# Patient Record
Sex: Female | Born: 1960 | Race: White | Hispanic: No | Smoking: Former smoker
Health system: Southern US, Community
[De-identification: ages and names within clinical notes are randomized; demographics above are authoritative.]

## PROBLEM LIST (undated history)

## (undated) DIAGNOSIS — K219 Gastro-esophageal reflux disease without esophagitis: Secondary | ICD-10-CM

## (undated) DIAGNOSIS — E119 Type 2 diabetes mellitus without complications: Secondary | ICD-10-CM

## (undated) DIAGNOSIS — F410 Panic disorder [episodic paroxysmal anxiety] without agoraphobia: Secondary | ICD-10-CM

## (undated) DIAGNOSIS — K5790 Diverticulosis of intestine, part unspecified, without perforation or abscess without bleeding: Secondary | ICD-10-CM

## (undated) DIAGNOSIS — I10 Essential (primary) hypertension: Secondary | ICD-10-CM

## (undated) HISTORY — DX: Diverticulosis of intestine, part unspecified, without perforation or abscess without bleeding: K57.90

## (undated) HISTORY — PX: TUBAL LIGATION: SHX77

## (undated) HISTORY — PX: OTHER SURGICAL HISTORY: SHX169

## (undated) HISTORY — DX: Essential (primary) hypertension: I10

## (undated) HISTORY — PX: BACK SURGERY: SHX140

---

## 2008-02-21 HISTORY — PX: COLONOSCOPY: SHX174

## 2008-12-14 ENCOUNTER — Emergency Department (HOSPITAL_COMMUNITY): Admission: EM | Admit: 2008-12-14 | Discharge: 2008-12-14 | Payer: Self-pay | Admitting: Emergency Medicine

## 2009-02-20 HISTORY — PX: CARDIAC CATHETERIZATION: SHX172

## 2009-06-09 ENCOUNTER — Emergency Department (HOSPITAL_COMMUNITY): Admission: EM | Admit: 2009-06-09 | Discharge: 2009-06-09 | Payer: Self-pay | Admitting: Emergency Medicine

## 2009-06-12 ENCOUNTER — Emergency Department (HOSPITAL_COMMUNITY): Admission: EM | Admit: 2009-06-12 | Discharge: 2009-06-12 | Payer: Self-pay | Admitting: Emergency Medicine

## 2009-08-05 ENCOUNTER — Emergency Department (HOSPITAL_COMMUNITY): Admission: EM | Admit: 2009-08-05 | Discharge: 2009-08-05 | Payer: Self-pay | Admitting: Emergency Medicine

## 2009-08-09 ENCOUNTER — Emergency Department (HOSPITAL_COMMUNITY): Admission: EM | Admit: 2009-08-09 | Discharge: 2009-08-09 | Payer: Self-pay | Admitting: Emergency Medicine

## 2009-09-01 ENCOUNTER — Ambulatory Visit (HOSPITAL_COMMUNITY): Admission: RE | Admit: 2009-09-01 | Discharge: 2009-09-01 | Payer: Self-pay | Admitting: General Surgery

## 2010-01-11 ENCOUNTER — Emergency Department (HOSPITAL_COMMUNITY): Admission: EM | Admit: 2010-01-11 | Discharge: 2010-01-11 | Payer: Self-pay | Admitting: Emergency Medicine

## 2010-02-26 ENCOUNTER — Emergency Department (HOSPITAL_COMMUNITY)
Admission: EM | Admit: 2010-02-26 | Discharge: 2010-02-26 | Payer: Self-pay | Source: Home / Self Care | Admitting: Emergency Medicine

## 2010-03-07 LAB — COMPREHENSIVE METABOLIC PANEL
ALT: 19 U/L (ref 0–35)
AST: 23 U/L (ref 0–37)
Albumin: 3.9 g/dL (ref 3.5–5.2)
Alkaline Phosphatase: 63 U/L (ref 39–117)
BUN: 8 mg/dL (ref 6–23)
CO2: 24 mEq/L (ref 19–32)
Calcium: 9.1 mg/dL (ref 8.4–10.5)
Chloride: 106 mEq/L (ref 96–112)
Creatinine, Ser: 1.02 mg/dL (ref 0.4–1.2)
GFR calc Af Amer: >60 mL/min (ref >60)
GFR calc non Af Amer: 58 mL/min — ABNORMAL LOW (ref >60)
Glucose, Bld: 101 mg/dL — ABNORMAL HIGH (ref 70–99)
Potassium: 3.9 mEq/L (ref 3.5–5.1)
Sodium: 141 mEq/L (ref 135–145)
Total Bilirubin: 0.6 mg/dL (ref 0.3–1.2)
Total Protein: 7 g/dL (ref 6.0–8.3)

## 2010-03-07 LAB — DIFFERENTIAL
Basophils Absolute: 0 10*3/uL (ref 0.0–0.1)
Basophils Relative: 0 % (ref 0–1)
Eosinophils Absolute: 0.1 10*3/uL (ref 0.0–0.7)
Eosinophils Relative: 1 % (ref 0–5)
Lymphocytes Relative: 6 % — ABNORMAL LOW (ref 12–46)
Lymphs Abs: 0.8 10*3/uL (ref 0.7–4.0)
Monocytes Absolute: 0.8 10*3/uL (ref 0.1–1.0)
Monocytes Relative: 6 % (ref 3–12)
Neutro Abs: 12.8 10*3/uL — ABNORMAL HIGH (ref 1.7–7.7)
Neutrophils Relative %: 88 % — ABNORMAL HIGH (ref 43–77)

## 2010-03-07 LAB — URINALYSIS, ROUTINE W REFLEX MICROSCOPIC
Bilirubin Urine: NEGATIVE
Ketones, ur: NEGATIVE mg/dL
Leukocytes, UA: NEGATIVE
Nitrite: NEGATIVE
Protein, ur: NEGATIVE mg/dL
Specific Gravity, Urine: 1.015 (ref 1.005–1.030)
Urine Glucose, Fasting: NEGATIVE mg/dL
Urobilinogen, UA: 0.2 mg/dL (ref 0.0–1.0)
pH: 8 (ref 5.0–8.0)

## 2010-03-07 LAB — CBC
HCT: 39.6 % (ref 36.0–46.0)
Hemoglobin: 13.7 g/dL (ref 12.0–15.0)
MCH: 32.5 pg (ref 26.0–34.0)
MCHC: 34.6 g/dL (ref 30.0–36.0)
MCV: 94.1 fL (ref 78.0–100.0)
Platelets: 268 10*3/uL (ref 150–400)
RBC: 4.21 MIL/uL (ref 3.87–5.11)
RDW: 13.9 % (ref 11.5–15.5)
WBC: 14.6 10*3/uL — ABNORMAL HIGH (ref 4.0–10.5)

## 2010-03-07 LAB — URINE MICROSCOPIC-ADD ON

## 2010-03-07 LAB — LIPASE, BLOOD: Lipase: 24 U/L (ref 11–59)

## 2010-03-07 LAB — PREGNANCY, URINE: Preg Test, Ur: NEGATIVE

## 2010-05-03 LAB — POCT PREGNANCY, URINE: Preg Test, Ur: NEGATIVE

## 2010-05-08 LAB — CBC
HCT: 36.3 % (ref 36.0–46.0)
Hemoglobin: 12.4 g/dL (ref 12.0–15.0)
MCH: 32.5 pg (ref 26.0–34.0)
MCHC: 34.3 g/dL (ref 30.0–36.0)
MCV: 94.7 fL (ref 78.0–100.0)
Platelets: 335 10*3/uL (ref 150–400)
RBC: 3.83 MIL/uL — ABNORMAL LOW (ref 3.87–5.11)
RDW: 14.1 % (ref 11.5–15.5)
WBC: 10.6 10*3/uL — ABNORMAL HIGH (ref 4.0–10.5)

## 2010-05-08 LAB — BASIC METABOLIC PANEL
BUN: 8 mg/dL (ref 6–23)
CO2: 26 mEq/L (ref 19–32)
Calcium: 9.6 mg/dL (ref 8.4–10.5)
Chloride: 105 mEq/L (ref 96–112)
Creatinine, Ser: 0.92 mg/dL (ref 0.4–1.2)
GFR calc Af Amer: >60 mL/min (ref >60)
GFR calc non Af Amer: >60 mL/min (ref >60)
Glucose, Bld: 86 mg/dL (ref 70–99)
Potassium: 4 mEq/L (ref 3.5–5.1)
Sodium: 138 mEq/L (ref 135–145)

## 2010-05-08 LAB — SURGICAL PCR SCREEN
MRSA, PCR: NEGATIVE
Staphylococcus aureus: NEGATIVE

## 2010-05-08 LAB — HCG, QUANTITATIVE, PREGNANCY: hCG, Beta Chain, Quant, S: <2 m[IU]/mL (ref <5)

## 2010-05-26 LAB — WET PREP, GENITAL
Clue Cells Wet Prep HPF POC: NONE SEEN
Trich, Wet Prep: NONE SEEN
WBC, Wet Prep HPF POC: NONE SEEN
Yeast Wet Prep HPF POC: NONE SEEN

## 2010-05-26 LAB — URINALYSIS, ROUTINE W REFLEX MICROSCOPIC
Bilirubin Urine: NEGATIVE
Glucose, UA: NEGATIVE mg/dL
Ketones, ur: NEGATIVE mg/dL
Leukocytes, UA: NEGATIVE
Nitrite: NEGATIVE
Protein, ur: NEGATIVE mg/dL
Specific Gravity, Urine: <1.005 — ABNORMAL LOW (ref 1.005–1.030)
Urobilinogen, UA: 0.2 mg/dL (ref 0.0–1.0)
pH: 5.5 (ref 5.0–8.0)

## 2010-05-26 LAB — URINE MICROSCOPIC-ADD ON

## 2010-05-26 LAB — RPR: RPR Ser Ql: NONREACTIVE

## 2010-05-26 LAB — GC/CHLAMYDIA PROBE AMP, GENITAL
Chlamydia, DNA Probe: NEGATIVE
GC Probe Amp, Genital: NEGATIVE

## 2010-05-26 LAB — PREGNANCY, URINE: Preg Test, Ur: NEGATIVE

## 2010-10-21 ENCOUNTER — Inpatient Hospital Stay (HOSPITAL_COMMUNITY)
Admission: EM | Admit: 2010-10-21 | Discharge: 2010-10-23 | DRG: 313 | Disposition: A | Discharge status: Home / Self Care | Payer: Self-pay | Attending: Internal Medicine | Admitting: Internal Medicine

## 2010-10-21 ENCOUNTER — Other Ambulatory Visit: Payer: Self-pay

## 2010-10-21 ENCOUNTER — Emergency Department (HOSPITAL_COMMUNITY): Payer: Self-pay

## 2010-10-21 ENCOUNTER — Encounter: Payer: Self-pay | Admitting: Emergency Medicine

## 2010-10-21 DIAGNOSIS — F410 Panic disorder [episodic paroxysmal anxiety] without agoraphobia: Secondary | ICD-10-CM | POA: Diagnosis present

## 2010-10-21 DIAGNOSIS — R079 Chest pain, unspecified: Secondary | ICD-10-CM | POA: Diagnosis present

## 2010-10-21 DIAGNOSIS — R0789 Other chest pain: Principal | ICD-10-CM | POA: Diagnosis present

## 2010-10-21 DIAGNOSIS — E876 Hypokalemia: Secondary | ICD-10-CM | POA: Diagnosis present

## 2010-10-21 DIAGNOSIS — I10 Essential (primary) hypertension: Secondary | ICD-10-CM | POA: Diagnosis present

## 2010-10-21 HISTORY — DX: Panic disorder (episodic paroxysmal anxiety): F41.0

## 2010-10-21 LAB — BASIC METABOLIC PANEL
BUN: 14 mg/dL (ref 6–23)
CO2: 28 mEq/L (ref 19–32)
Chloride: 96 mEq/L (ref 96–112)
Creatinine, Ser: 0.8 mg/dL (ref 0.50–1.10)
Glucose, Bld: 128 mg/dL — ABNORMAL HIGH (ref 70–99)
Potassium: 3.2 mEq/L — ABNORMAL LOW (ref 3.5–5.1)

## 2010-10-21 LAB — PROTIME-INR
INR: 1.01 (ref 0.00–1.49)
Prothrombin Time: 13.5 seconds (ref 11.6–15.2)

## 2010-10-21 LAB — CBC
HCT: 42 % (ref 36.0–46.0)
Hemoglobin: 14.6 g/dL (ref 12.0–15.0)
MCHC: 34.8 g/dL (ref 30.0–36.0)
WBC: 16.4 10*3/uL — ABNORMAL HIGH (ref 4.0–10.5)

## 2010-10-21 LAB — POCT I-STAT TROPONIN I: Troponin i, poc: 0 ng/mL (ref 0.00–0.08)

## 2010-10-21 LAB — DIFFERENTIAL
Basophils Absolute: 0 10*3/uL (ref 0.0–0.1)
Basophils Relative: 0 % (ref 0–1)
Lymphocytes Relative: 21 % (ref 12–46)
Monocytes Absolute: 1.4 10*3/uL — ABNORMAL HIGH (ref 0.1–1.0)
Monocytes Relative: 8 % (ref 3–12)
Neutro Abs: 11.5 10*3/uL — ABNORMAL HIGH (ref 1.7–7.7)
Neutrophils Relative %: 70 % (ref 43–77)

## 2010-10-21 LAB — CARDIAC PANEL(CRET KIN+CKTOT+MB+TROPI)
Relative Index: INVALID (ref 0.0–2.5)
Troponin I: <0.3 ng/mL (ref <0.30)

## 2010-10-21 MED ORDER — MORPHINE SULFATE 4 MG/ML IJ SOLN
4.0000 mg | Freq: Once | INTRAMUSCULAR | Status: AC
  Administered 2010-10-21: 4 mg via INTRAVENOUS
  Filled 2010-10-21: qty 1

## 2010-10-21 MED ORDER — ASPIRIN 81 MG PO CHEW
324.0000 mg | CHEWABLE_TABLET | Freq: Once | ORAL | Status: AC
  Administered 2010-10-21: 324 mg via ORAL
  Filled 2010-10-21: qty 4

## 2010-10-21 MED ORDER — HEPARIN BOLUS VIA INFUSION
5000.0000 [IU] | Freq: Once | INTRAVENOUS | Status: AC
  Administered 2010-10-21: 5000 [IU] via INTRAVENOUS
  Filled 2010-10-21: qty 5000

## 2010-10-21 MED ORDER — POTASSIUM CHLORIDE CRYS ER 20 MEQ PO TBCR
40.0000 meq | EXTENDED_RELEASE_TABLET | Freq: Once | ORAL | Status: AC
  Administered 2010-10-21: 40 meq via ORAL
  Filled 2010-10-21: qty 2

## 2010-10-21 MED ORDER — HEPARIN (PORCINE) IN NACL 100-0.45 UNIT/ML-% IJ SOLN: 12.0000 [IU]/kg/h | INTRAMUSCULAR | Status: DC

## 2010-10-21 MED ORDER — IBUPROFEN 400 MG PO TABS
400.0000 mg | ORAL_TABLET | Freq: Once | ORAL | Status: AC
  Administered 2010-10-21: 400 mg via ORAL
  Filled 2010-10-21: qty 1

## 2010-10-21 MED ORDER — HEPARIN (PORCINE) IN NACL 100-0.45 UNIT/ML-% IJ SOLN
INTRAMUSCULAR | Status: AC
  Filled 2010-10-21: qty 250

## 2010-10-21 MED ORDER — ALPRAZOLAM 0.5 MG PO TABS
1.0000 mg | ORAL_TABLET | Freq: Once | ORAL | Status: AC
  Administered 2010-10-21: 1 mg via ORAL
  Filled 2010-10-21: qty 2

## 2010-10-21 MED ORDER — NITROGLYCERIN 2 % TD OINT
1.0000 [in_us] | TOPICAL_OINTMENT | Freq: Once | TRANSDERMAL | Status: AC
  Administered 2010-10-21: 66.6667 [in_us] via TOPICAL
  Filled 2010-10-21: qty 1

## 2010-10-21 MED ORDER — NITROGLYCERIN IN D5W 200-5 MCG/ML-% IV SOLN
5.0000 ug/min | Freq: Once | INTRAVENOUS | Status: AC
  Administered 2010-10-21: 5 ug/min via INTRAVENOUS
  Filled 2010-10-21: qty 250

## 2010-10-21 NOTE — H&P (Signed)
Tiffany Barker is an 50 y.o. female.  She is followed by the health department.  Chief Complaint: Chest pain, and high blood pressure  HPI: This is a 50 year old Caucasian female, with a past medical history of hypertension. This was diagnosed about 6 months ago. She was started on hydrochlorothiazide. About a week and a half ago she ran out of her medication and so missed taking about 5 days' worth. She noticed that her blood pressure was very high at that time. She also had some chest pain, which was a pressure-like sensation, but it lasted only a few minutes and then she was comfortable. She tells me that her blood pressure has been high in the last few weeks. She's been having some headaches and dizzy spells because of the high blood pressure. This morning she started having chest pressure. Was described as a pressure-like sensation in the center of her chest. It was 10 out of 10 in intensity. It was associated with some shortness of breath. She had some chronic cough. Had some palpitations. Denies any nausea, vomiting. The pain did not radiate to her arm or neck. Currently, the pain is more in the epigastric area and is 3 out of 10 in intensity. She tells me that she had similar chest pain about 3-4 years ago and had a stress test in [place], which was thought to be normal. She was also very anxious earlier today and had had a panic attack. Denies any focal weakness.   Prior to Admission medications   Medication Sig Start Date End Date Taking? Authorizing Tavari Loadholt  diphenhydrAMINE (BENADRYL) 25 mg capsule Take 25 mg by mouth at bedtime as needed.     Yes Historical Chuong Casebeer, MD  hydrochlorothiazide 25 MG tablet Take 25 mg by mouth daily.     Yes Historical Perris Conwell, MD  Ibuprofen-Diphenhydramine Cit (ADVIL PM PO) Take 1 tablet by mouth at bedtime as needed. For sleep    Yes Historical Keiandra Sullenger, MD    Allergies:  Allergies  Allergen Reactions  . Wasp Venom Anaphylaxis  . Percocet  (Oxycodone-Acetaminophen) Itching    Past Medical History  Diagnosis Date  . Hypertension   . Panic attacks     Past Surgical History  Procedure Date  . Back surgery   . Tubal ligation     Social History:  reports that she has quit smoking. She does not have any smokeless tobacco history on file. She reports that she drinks alcohol. She reports that she does not use illicit drugs.  Family History:  Family History  Problem Relation Age of Onset  . Coronary artery disease Mother   . Coronary artery disease Father     Review of Systems  Constitutional: Negative.   Eyes: Negative.   Respiratory: Positive for shortness of breath.   Cardiovascular: Positive for chest pain and palpitations.  Gastrointestinal: Positive for abdominal pain.  Genitourinary: Negative.   Musculoskeletal: Negative.   Skin: Negative.   Neurological: Positive for headaches.  Endo/Heme/Allergies: Negative.   Psychiatric/Behavioral: The patient is nervous/anxious.     Blood pressure 146/94, pulse 83, temperature 98.5 F (36.9 C), temperature source Oral, resp. rate 17, height 5\' 3"  (1.6 m), weight 83.915 kg (185 lb), last menstrual period 10/14/2010, SpO2 98.00%. Physical Exam  Vitals reviewed. Constitutional: She is oriented to person, place, and time. She appears well-developed and well-nourished. No distress.  HENT:  Head: Normocephalic and atraumatic.  Mouth/Throat: Oropharynx is clear and moist. No oropharyngeal exudate.  Eyes: EOM are normal.  Pupils are equal, round, and reactive to light. Right eye exhibits no discharge. Left eye exhibits no discharge. No scleral icterus.  Neck: Normal range of motion. Neck supple. No tracheal deviation present. No thyromegaly present.  Cardiovascular: Normal rate, regular rhythm, normal heart sounds, intact distal pulses and normal pulses.   Occasional extrasystoles are present. Exam reveals no gallop, no distant heart sounds and no friction rub.   No murmur  heard. Pulmonary/Chest: Effort normal and breath sounds normal. No respiratory distress. She has no wheezes. She has no rales.  Abdominal: Soft. She exhibits no mass. There is tenderness in the epigastric area. There is no rebound and no guarding.  Musculoskeletal: Normal range of motion.  Lymphadenopathy:    She has no cervical adenopathy.  Neurological: She is alert and oriented to person, place, and time. No cranial nerve deficit.  Skin: Skin is warm and dry. She is not diaphoretic.  Psychiatric: She has a normal mood and affect.     Results for orders placed during the hospital encounter of 10/21/10 (from the past 48 hour(s))  BASIC METABOLIC PANEL     Status: Abnormal   Collection Time   10/21/10  6:35 PM      Component Value Range Comment   Sodium 136  135 - 145 (mEq/L)    Potassium 3.2 (*) 3.5 - 5.1 (mEq/L)    Chloride 96  96 - 112 (mEq/L)    CO2 28  19 - 32 (mEq/L)    Glucose, Bld 128 (*) 70 - 99 (mg/dL)    BUN 14  6 - 23 (mg/dL)    Creatinine, Ser 1.61  0.50 - 1.10 (mg/dL)    Calcium 09.6  8.4 - 10.5 (mg/dL)    GFR calc non Af Amer >60  >60 (mL/min)    GFR calc Af Amer >60  >60 (mL/min)   CBC     Status: Abnormal   Collection Time   10/21/10  6:35 PM      Component Value Range Comment   WBC 16.4 (*) 4.0 - 10.5 (K/uL)    RBC 4.47  3.87 - 5.11 (MIL/uL)    Hemoglobin 14.6  12.0 - 15.0 (g/dL)    HCT 04.5  40.9 - 81.1 (%)    MCV 94.0  78.0 - 100.0 (fL)    MCH 32.7  26.0 - 34.0 (pg)    MCHC 34.8  30.0 - 36.0 (g/dL)    RDW 91.4  78.2 - 95.6 (%)    Platelets 328  150 - 400 (K/uL)   DIFFERENTIAL     Status: Abnormal   Collection Time   10/21/10  6:35 PM      Component Value Range Comment   Neutrophils Relative 70  43 - 77 (%)    Neutro Abs 11.5 (*) 1.7 - 7.7 (K/uL)    Lymphocytes Relative 21  12 - 46 (%)    Lymphs Abs 3.4  0.7 - 4.0 (K/uL)    Monocytes Relative 8  3 - 12 (%)    Monocytes Absolute 1.4 (*) 0.1 - 1.0 (K/uL)    Eosinophils Relative 1  0 - 5 (%)     Eosinophils Absolute 0.2  0.0 - 0.7 (K/uL)    Basophils Relative 0  0 - 1 (%)    Basophils Absolute 0.0  0.0 - 0.1 (K/uL)   CARDIAC PANEL(CRET KIN+CKTOT+MB+TROPI)     Status: Normal   Collection Time   10/21/10  6:36 PM      Component  Value Range Comment   Total CK 85  7 - 177 (U/L)    CK, MB 1.5  0.3 - 4.0 (ng/mL)    Troponin I <0.30  <0.30 (ng/mL)    Relative Index RELATIVE INDEX IS INVALID  0.0 - 2.5    APTT     Status: Normal   Collection Time   10/21/10  6:36 PM      Component Value Range Comment   aPTT 28  24 - 37 (seconds)   PROTIME-INR     Status: Normal   Collection Time   10/21/10  6:36 PM      Component Value Range Comment   Prothrombin Time 13.5  11.6 - 15.2 (seconds)    INR 1.01  0.00 - 1.49    POCT I-STAT TROPONIN I     Status: Normal   Collection Time   10/21/10 10:22 PM      Component Value Range Comment   Troponin i, poc 0.00  0.00 - 0.08 (ng/mL)    Comment 3             Dg Chest Portable 1 View  10/21/2010  *RADIOLOGY REPORT*  Clinical Data: Chest pain and pressure.  Hypertension.  Panic attacks.  PORTABLE CHEST - 1 VIEW  Comparison: 01/11/2010  Findings: Midline trachea.  Normal heart size and mediastinal contours. No pleural effusion or pneumothorax.  Clear lungs.  IMPRESSION: No acute cardiopulmonary disease.  Original Report Authenticated By: Consuello Bossier, M.D.   EKG shows sinus tachycardia at 101. Axis is normal. No definite Q waves. Intervals are normal. Some T wave flattening noted. No acute ST changes noted. No older EKGs available for comparison.  Assessment/Plan  Principal Problem:  *Chest pain Active Problems:  Accelerated hypertension   #1 chest pain: The case was discussed by the ED physician with Dr. Shirlee Latch. And it was felt that the chest pain is probably from her accelerated hypertension. In any case her EKG looks fine. There are no ischemic changes. She will be observed in the hospital overnight. She'll be ruled out for acute chronic syndrome  by serial cardiac enzymes. EKG will be repeated in the morning. A lipid panel will also be checked.  #2 accelerated hypertension: We will put on nitro paste. We'll give her metoprolol 3 times a day. She may require additional antihypertensive agents. Depending on what her blood pressures do overnight further management options will have to be considered.  #3 hypokalemia. This will be repleted.  #4 mild epigastric pain: She tells me that she has gallstones. So, we'll go ahead and check LFTs, as well as lipase level.  We will put her on full dose Lovenox for now till her enzymes come back negative.  She's a full code.  Further management decisions will depend on results of further testing and patient's response to treatment.  Leukocytosis: The etiology for this is not entirely clear. She is afebrile. We will recheck counts in the morning.  KRISHNAN,GOKUL 10/21/2010, 11:48 PM

## 2010-10-21 NOTE — ED Provider Notes (Signed)
Scribed for Vida Roller, MD, the patient was seen in room APA18/[facility]18. This chart was scribed by [employer]. The patient's care started at 18:09  CSN: [id number] Arrival date & time: 10/21/2010  6:09 PM  Chief Complaint  Patient presents with  . Chest Pain   HPI Tiffany Barker is a 50 y.o. female with a history of HTN who presents to the Emergency Department complaining of intermittent chest pain, onset 2 weeks. She describes pain as a pressure and states that it worsened today and started suddenly with mild shortness of breath. Patient Complains that blood pressure was 176/126 before she took her medication. Has HTN prescriptions but forgot to go get them. Has been on medication for the past 10-11 days without relief. Patient denies nausea, vomiting, diarrhea or dizziness.Chest pain is alleviated by laying down but aggravated by walking up the stairs. Pt reports to having a stress test in 2007 and was told of a minor blockage "but nothing to worry about".  HPI ELEMENTS:  Location: Chest  Duration: 2 weeks  Timing: constant Quality: Pressure  Modifying factors: aggravated by walking up the stair and alleviated by laying down Context:  as above  Associated symptoms: as above    Past Medical History  Diagnosis Date  . Hypertension   . Panic attacks    MEDICATIONS:  Previous Medications   DIPHENHYDRAMINE (BENADRYL) 25 MG CAPSULE    Take 25 mg by mouth at bedtime as needed.     HYDROCHLOROTHIAZIDE 25 MG TABLET    Take 25 mg by mouth daily.     IBUPROFEN-DIPHENHYDRAMINE CIT (ADVIL PM PO)    Take 1 tablet by mouth at bedtime as needed. For sleep      ALLERGIES:  Allergies as of 10/21/2010 - Review Complete 10/21/2010  Allergen Reaction Noted  . Wasp venom Anaphylaxis 10/21/2010  . Percocet (oxycodone-acetaminophen) Itching 10/21/2010      Past Surgical History  Procedure Date  . Back surgery   . Tubal ligation     History reviewed. No pertinent family  history. diabetes mellitus   History  Substance Use Topics  . Smoking status: Former Games developer  . Smokeless tobacco: Not on file  . Alcohol Use: Yes     occasionaly    OB History    Grav Para Term Preterm Abortions TAB SAB Ect Mult Living                  Review of Systems  Physical Exam  BP 157/104  Pulse 91  Temp(Src) 98.5 F (36.9 C) (Oral)  Resp 17  SpO2 97%  LMP 10/14/2010  Physical Exam  Constitutional: She is oriented to person, place, and time.  HENT:  Mouth/Throat: Oropharynx is clear and moist.  Neck:       Small lymphadenotomy right anterior chain about 1cm.  Cardiovascular: Normal rate, regular rhythm and normal heart sounds.   No murmur heard. Pulmonary/Chest: Effort normal and breath sounds normal. No respiratory distress. She has no wheezes. She has no rales.  Abdominal: Soft. Bowel sounds are normal.  Musculoskeletal: She exhibits no edema (not different from baseline).  Neurological: She is alert and oriented to person, place, and time. No cranial nerve deficit.    ED Course  Procedures  OTHER DATA REVIEWED: Nursing notes, vital signs, and past medical records reviewed.    DIAGNOSTIC STUDIES: Oxygen Saturation is 97% on room air, normal by my interpretation.    LABS / RADIOLOGY:  Results for orders placed during  the hospital encounter of 10/21/10  BASIC METABOLIC PANEL      Component Value Range   Sodium 136  135 - 145 (mEq/L)   Potassium 3.2 (*) 3.5 - 5.1 (mEq/L)   Chloride 96  96 - 112 (mEq/L)   CO2 28  19 - 32 (mEq/L)   Glucose, Bld 128 (*) 70 - 99 (mg/dL)   BUN 14  6 - 23 (mg/dL)   Creatinine, Ser 1.61  0.50 - 1.10 (mg/dL)   Calcium 09.6  8.4 - 10.5 (mg/dL)   GFR calc non Af Amer >60  >60 (mL/min)   GFR calc Af Amer >60  >60 (mL/min)  CARDIAC PANEL(CRET KIN+CKTOT+MB+TROPI)      Component Value Range   Total CK 85  7 - 177 (U/L)   CK, MB 1.5  0.3 - 4.0 (ng/mL)   Troponin I <0.30  <0.30 (ng/mL)   Relative Index RELATIVE INDEX IS  INVALID  0.0 - 2.5   CBC      Component Value Range   WBC 16.4 (*) 4.0 - 10.5 (K/uL)   RBC 4.47  3.87 - 5.11 (MIL/uL)   Hemoglobin 14.6  12.0 - 15.0 (g/dL)   HCT 04.5  40.9 - 81.1 (%)   MCV 94.0  78.0 - 100.0 (fL)   MCH 32.7  26.0 - 34.0 (pg)   MCHC 34.8  30.0 - 36.0 (g/dL)   RDW 91.4  78.2 - 95.6 (%)   Platelets 328  150 - 400 (K/uL)  DIFFERENTIAL      Component Value Range   Neutrophils Relative 70  43 - 77 (%)   Neutro Abs 11.5 (*) 1.7 - 7.7 (K/uL)   Lymphocytes Relative 21  12 - 46 (%)   Lymphs Abs 3.4  0.7 - 4.0 (K/uL)   Monocytes Relative 8  3 - 12 (%)   Monocytes Absolute 1.4 (*) 0.1 - 1.0 (K/uL)   Eosinophils Relative 1  0 - 5 (%)   Eosinophils Absolute 0.2  0.0 - 0.7 (K/uL)   Basophils Relative 0  0 - 1 (%)   Basophils Absolute 0.0  0.0 - 0.1 (K/uL)  APTT      Component Value Range   aPTT 28  24 - 37 (seconds)  PROTIME-INR      Component Value Range   Prothrombin Time 13.5  11.6 - 15.2 (seconds)   INR 1.01  0.00 - 1.49     Dg Chest Portable 1 View  10/21/2010  *RADIOLOGY REPORT*  Clinical Data: Chest pain and pressure.  Hypertension.  Panic attacks.  PORTABLE CHEST - 1 VIEW  Comparison: 01/11/2010  Findings: Midline trachea.  Normal heart size and mediastinal contours. No pleural effusion or pneumothorax.  Clear lungs.  IMPRESSION: No acute cardiopulmonary disease.  Original Report Authenticated By: Consuello Bossier, M.D.      ED COURSE / COORDINATION OF CARE: 18:25 EDMD examined patient and ordered the following Orders Placed This Encounter  Procedures  . DG Chest Portable 1 View  . Basic metabolic panel  . Cardiac panel(cret kin+cktot+mb+tropi)  . CBC  . Differential  . APTT  . Protime-INR  . Cardiac monitoring  . ED EKG     MDM: Ongoing intermittent chest pain getting worse today and persistent for 6 hours by arrival. EKG was nonspecific T-wave findings but otherwise unchanged from prior EKG from July of 2011. Troponin negative, chest x-ray  negative, Will consult cardiology for evaluation and admission.  Pain has almost completely improved on a  nitro drip and morphine for her headache. She still has mild pain.  Cardiology consultation, Dr.Dalton mcLean request medical admission unless enzymes are positive. At this time she has no focal ischemia on her EKG, negative cardiac enzymes and a negative chest x-ray. She is on nitroglycerin and feeling better. Second set of enzymes ordered per Dr. Jearld Pies.    ED ECG REPORT   Date: 10/21/2010 6:12 PM   Rate: 101  Rhythm: sinus tachycardia  QRS Axis: normal  Intervals: normal  ST/T Wave abnormalities: nonspecific T wave changes  Conduction Disutrbances:none  Narrative Interpretation: non spec T wave changes  Old EKG Reviewed: unchanged from 08/25/09  Second cardiac marker is normal.  SCRIBE ATTESTATION: I personally performed the services described in this documentation, which was scribed in my presence. The recorded information has been reviewed and considered. Vida Roller, MD     Hospitalist will admit.  Vida Roller, MD 10/21/10 [phone]

## 2010-10-21 NOTE — ED Notes (Signed)
Pt c/o chest pressure intermittant x 2 weeks. pain worse today and started suddenly with "little " sob. States normally will lay down and pressure eases up but this time it didn't. Nondiaphoretic. Denies n/v/d/dizzy.alert/orietned. Appears comfortable.

## 2010-10-21 NOTE — ED Notes (Signed)
Pt also c/o headache intermittant x 2 wks also.

## 2010-10-22 ENCOUNTER — Encounter (HOSPITAL_COMMUNITY): Payer: Self-pay | Admitting: *Deleted

## 2010-10-22 LAB — COMPREHENSIVE METABOLIC PANEL
ALT: 37 U/L — ABNORMAL HIGH (ref 0–35)
AST: 30 U/L (ref 0–37)
Alkaline Phosphatase: 83 U/L (ref 39–117)
CO2: 28 mEq/L (ref 19–32)
Calcium: 9.3 mg/dL (ref 8.4–10.5)
Chloride: 99 mEq/L (ref 96–112)
GFR calc Af Amer: >60 mL/min (ref >60)
GFR calc non Af Amer: >60 mL/min (ref >60)
Glucose, Bld: 116 mg/dL — ABNORMAL HIGH (ref 70–99)
Potassium: 3.3 mEq/L — ABNORMAL LOW (ref 3.5–5.1)
Sodium: 138 mEq/L (ref 135–145)
Total Bilirubin: 0.2 mg/dL — ABNORMAL LOW (ref 0.3–1.2)

## 2010-10-22 LAB — URINALYSIS, ROUTINE W REFLEX MICROSCOPIC
Glucose, UA: NEGATIVE mg/dL
Specific Gravity, Urine: 1.01 (ref 1.005–1.030)

## 2010-10-22 LAB — MAGNESIUM: Magnesium: 2.1 mg/dL (ref 1.5–2.5)

## 2010-10-22 LAB — CARDIAC PANEL(CRET KIN+CKTOT+MB+TROPI)
CK, MB: 1.6 ng/mL (ref 0.3–4.0)
Relative Index: INVALID (ref 0.0–2.5)
Total CK: 76 U/L (ref 7–177)
Total CK: 76 U/L (ref 7–177)
Total CK: 82 U/L (ref 7–177)

## 2010-10-22 LAB — CBC
MCH: 32.5 pg (ref 26.0–34.0)
MCHC: 34.3 g/dL (ref 30.0–36.0)
Platelets: 314 10*3/uL (ref 150–400)
RDW: 13.4 % (ref 11.5–15.5)

## 2010-10-22 LAB — HEPATIC FUNCTION PANEL
Albumin: 4.1 g/dL (ref 3.5–5.2)
Indirect Bilirubin: 0.1 mg/dL — ABNORMAL LOW (ref 0.3–0.9)
Total Bilirubin: 0.2 mg/dL — ABNORMAL LOW (ref 0.3–1.2)

## 2010-10-22 LAB — MRSA PCR SCREENING: MRSA by PCR: INVALID — AB

## 2010-10-22 LAB — LIPASE, BLOOD: Lipase: 33 U/L (ref 11–59)

## 2010-10-22 LAB — URINE MICROSCOPIC-ADD ON

## 2010-10-22 MED ORDER — IBUPROFEN 400 MG PO TABS
400.0000 mg | ORAL_TABLET | Freq: Three times a day (TID) | ORAL | Status: DC
  Administered 2010-10-22 (×3): 400 mg via ORAL
  Filled 2010-10-22 (×4): qty 1

## 2010-10-22 MED ORDER — ALPRAZOLAM 0.5 MG PO TABS
0.5000 mg | ORAL_TABLET | Freq: Two times a day (BID) | ORAL | Status: DC | PRN
  Administered 2010-10-22 – 2010-10-23 (×2): 0.5 mg via ORAL
  Filled 2010-10-22 (×2): qty 1

## 2010-10-22 MED ORDER — ENOXAPARIN SODIUM 40 MG/0.4ML ~~LOC~~ SOLN
40.0000 mg | SUBCUTANEOUS | Status: DC
  Filled 2010-10-22: qty 0.4

## 2010-10-22 MED ORDER — MORPHINE SULFATE 2 MG/ML IJ SOLN: 2.0000 mg | INTRAMUSCULAR | Status: DC | PRN

## 2010-10-22 MED ORDER — ONDANSETRON HCL 4 MG PO TABS: 4.0000 mg | ORAL_TABLET | Freq: Four times a day (QID) | ORAL | Status: DC | PRN

## 2010-10-22 MED ORDER — ASPIRIN EC 81 MG PO TBEC
81.0000 mg | DELAYED_RELEASE_TABLET | Freq: Every day | ORAL | Status: DC
  Administered 2010-10-22 – 2010-10-23 (×2): 81 mg via ORAL
  Filled 2010-10-22 (×2): qty 1

## 2010-10-22 MED ORDER — HYDROCHLOROTHIAZIDE 25 MG PO TABS
25.0000 mg | ORAL_TABLET | Freq: Every day | ORAL | Status: DC
  Administered 2010-10-22 – 2010-10-23 (×2): 25 mg via ORAL
  Filled 2010-10-22 (×2): qty 1

## 2010-10-22 MED ORDER — ALUM & MAG HYDROXIDE-SIMETH 200-200-20 MG/5ML PO SUSP: 30.0000 mL | Freq: Four times a day (QID) | ORAL | Status: DC | PRN

## 2010-10-22 MED ORDER — SODIUM CHLORIDE 0.9 % IJ SOLN
3.0000 mL | Freq: Two times a day (BID) | INTRAMUSCULAR | Status: DC
  Administered 2010-10-22 (×3): 3 mL via INTRAVENOUS
  Filled 2010-10-22 (×4): qty 3

## 2010-10-22 MED ORDER — METOPROLOL TARTRATE 25 MG PO TABS
25.0000 mg | ORAL_TABLET | Freq: Two times a day (BID) | ORAL | Status: DC
  Administered 2010-10-22 – 2010-10-23 (×3): 25 mg via ORAL
  Filled 2010-10-22 (×3): qty 1

## 2010-10-22 MED ORDER — ONDANSETRON HCL 4 MG/2ML IJ SOLN: 4.0000 mg | Freq: Four times a day (QID) | INTRAMUSCULAR | Status: DC | PRN

## 2010-10-22 MED ORDER — ENOXAPARIN SODIUM 100 MG/ML ~~LOC~~ SOLN
1.0000 mg/kg | Freq: Two times a day (BID) | SUBCUTANEOUS | Status: DC
  Administered 2010-10-22: 85 mg via SUBCUTANEOUS
  Filled 2010-10-22: qty 1

## 2010-10-22 MED ORDER — HYDROCODONE-ACETAMINOPHEN 5-325 MG PO TABS: 1.0000 | ORAL_TABLET | ORAL | Status: DC | PRN

## 2010-10-22 MED ORDER — METOPROLOL TARTRATE 25 MG PO TABS: 25.0000 mg | ORAL_TABLET | Freq: Three times a day (TID) | ORAL | Status: DC

## 2010-10-22 MED ORDER — NITROGLYCERIN 2 % TD OINT: 1.0000 [in_us] | TOPICAL_OINTMENT | Freq: Four times a day (QID) | TRANSDERMAL | Status: DC

## 2010-10-22 MED ORDER — PROMETHAZINE HCL 12.5 MG PO TABS: 12.5000 mg | ORAL_TABLET | Freq: Four times a day (QID) | ORAL | Status: DC | PRN

## 2010-10-22 MED ORDER — SENNA 8.6 MG PO TABS: 2.0000 | ORAL_TABLET | Freq: Every day | ORAL | Status: DC | PRN

## 2010-10-22 MED ORDER — DOCUSATE SODIUM 100 MG PO CAPS
100.0000 mg | ORAL_CAPSULE | Freq: Two times a day (BID) | ORAL | Status: DC
  Administered 2010-10-22 – 2010-10-23 (×3): 100 mg via ORAL
  Filled 2010-10-22 (×3): qty 1

## 2010-10-22 MED ORDER — PROMETHAZINE HCL 25 MG/ML IJ SOLN: 12.5000 mg | Freq: Four times a day (QID) | INTRAMUSCULAR | Status: DC | PRN

## 2010-10-22 MED ORDER — ALBUTEROL SULFATE (5 MG/ML) 0.5% IN NEBU: 2.5000 mg | INHALATION_SOLUTION | RESPIRATORY_TRACT | Status: DC | PRN

## 2010-10-22 NOTE — Progress Notes (Addendum)
Subjective: Currently denies chest pain, feels very anxious being in the ICU with all the needle sticks and wires.  Objective: Vital signs in last 24 hours: Temp:  [98 F (36.7 C)-98.5 F (36.9 C)] 98.5 F (36.9 C) (09/01 0800) Pulse Rate:  [66-91] 86  (09/01 0800) Resp:  [11-23] 19  (09/01 0800) BP: (113-159)/(75-109) 124/78 mmHg (09/01 0800) SpO2:  [96 %-100 %] 100 % (09/01 0800) Weight:  [83.915 kg (185 lb)-86.3 kg (190 lb 4.1 oz)] 190 lb 4.1 oz (86.3 kg) (09/01 0121) Weight change:  Last BM Date: 10/20/10  Intake/Output from previous day: 08/31 0701 - 09/01 0700 In: 3 [I.V.:3] Out: -      Physical Exam: General: Alert, awake, oriented x3, in no acute distress. HEENT: No bruits, no goiter. Heart: Regular rate and rhythm, without murmurs, rubs, gallops. Lungs: Clear to auscultation bilaterally. Abdomen: Soft, nontender, nondistended, positive bowel sounds. Extremities: No clubbing cyanosis or edema with positive pedal pulses. Neuro: Grossly intact, nonfocal.    Lab Results:  Basename 10/22/10 0159 10/21/10 1835  WBC 15.7* 16.4*  HGB 13.5 14.6  HCT 39.4 42.0  PLT 314 328    Basename 10/22/10 0159 10/21/10 1835  NA 138 136  K 3.3* 3.2*  CL 99 96  CO2 28 28  GLUCOSE 116* 128*  BUN 14 14  CREATININE 0.75 0.80  CALCIUM 9.3 10.4   Recent Results (from the past 240 hour(s))  MRSA PCR SCREENING     Status: Abnormal   Collection Time   10/22/10  2:19 AM      Component Value Range Status Comment   MRSA by PCR INVALID RESULTS, SPECIMEN SENT FOR CULTURE (*) NEGATIVE  Final      Studies/Results: Dg Chest Portable 1 View  10/21/2010  *RADIOLOGY REPORT*  Clinical Data: Chest pain and pressure.  Hypertension.  Panic attacks.  PORTABLE CHEST - 1 VIEW  Comparison: 01/11/2010  Findings: Midline trachea.  Normal heart size and mediastinal contours. No pleural effusion or pneumothorax.  Clear lungs.  IMPRESSION: No acute cardiopulmonary disease.  Original Report  Authenticated By: Consuello Bossier, M.D.    Medications: Scheduled Meds:   . ALPRAZolam  1 mg Oral Once  . aspirin  324 mg Oral Once  . aspirin EC  81 mg Oral Daily  . docusate sodium  100 mg Oral BID  . enoxaparin (LOVENOX) injection  40 mg Subcutaneous Q24H  . heparin  5,000 Units Intravenous Once  . hydrochlorothiazide  25 mg Oral Daily  . ibuprofen  400 mg Oral Once  . ibuprofen  400 mg Oral TID  . metoprolol tartrate  25 mg Oral BID  . morphine  4 mg Intravenous Once  . nitroGLYCERIN  1 inch Topical Once  . nitroGLYCERIN  5-200 mcg/min Intravenous Once  . potassium chloride  40 mEq Oral Once  . sodium chloride  3 mL Intravenous Q12H  . DISCONTD: enoxaparin  1 mg/kg Subcutaneous Q12H  . DISCONTD: heparin      . DISCONTD: metoprolol tartrate  25 mg Oral TID  . DISCONTD: nitroGLYCERIN  1 inch Topical Q6H   Continuous Infusions:   . DISCONTD: heparin Stopped (10/21/10 2145)   PRN Meds:.albuterol, ALPRAZolam, alum & mag hydroxide-simeth, HYDROcodone-acetaminophen, morphine, ondansetron (ZOFRAN) IV, ondansetron, promethazine, promethazine, senna  Assessment/Plan:  Principal Problem:  *Chest pain Active Problems:  Accelerated hypertension  #1 chest pain: So far 2 sets of cardiac enzymes are negative and her EKG is nonacute. At this point I believe she is  stable for transfer to the floor. Do not believe she requires therapeutic anticoagulation, we'll change Lovenox over to prophylactic dose. Continue aspirin. Chest pain is likely secondary to her accelerated hypertension, no need for cardiac evaluation or stress test at this time.  #2 accelerated hypertension: Blood pressure is improved today. We'll discontinue nitroglycerin patch, restart her back on her hydrochlorothiazide, we'll change metoprolol over to twice daily to increase compliance.  #3 anxiety: We'll start her on when necessary Xanax which she takes at home, moving her to the floor should also hopefully if some of  this anxiety.  #4 leukocytosis: Unknown etiology. Is afebrile. Chest x-ray negative for pneumonia, check urinalysis.  #5 DVT prophylaxis: Lovenox.   LOS: 1 day   HERNANDEZ ACOSTA,Jewelz Kobus 10/22/2010, 9:19 AM

## 2010-10-22 NOTE — Plan of Care (Signed)
Problem: Consults Goal: Chest Pain Patient Education (See Patient Education module for education specifics.) Outcome: Progressing Has had several episodes for the past 2 weeks, decided to come in tonight  Problem: Phase I Progression Outcomes Goal: Anginal pain relieved Outcome: Progressing Pain relieved with 4L [place] supplementary oxygen, nitro paste to L upper back.  Goal: Aspirin unless contraindicated Outcome: Completed/Met Date Met:  10/22/10 Ordered by md in ed

## 2010-10-22 NOTE — Progress Notes (Signed)
ANTICOAGULATION CONSULT NOTE - Initial Consult  Pharmacy Consult for Lovenox Indication: chest pain/ACS  Patient Measurements: Height: 5\' 3"  (160 cm) Weight: 190 lb 4.1 oz (86.3 kg) IBW/kg (Calculated) : 52.4   Vital Signs: Temp: 98.5 F (36.9 C) (09/01 0800) Temp src: Oral (09/01 0800) BP: 124/78 mmHg (09/01 0800) Pulse Rate: 86  (09/01 0800)  Labs:  Basename 10/22/10 0159 10/21/10 1836 10/21/10 1835  HGB 13.5 -- 14.6  HCT 39.4 -- 42.0  PLT 314 -- 328  APTT -- 28 --  LABPROT -- 13.5 --  INR -- 1.01 --  HEPARINUNFRC -- -- --  CREATININE 0.75 -- 0.80  CRCLEARANCE -- -- --  CKTOTAL 76 85 --  CKMB 1.5 1.5 --  TROPONINI <0.30 <0.30 --    Medical History: Past Medical History  Diagnosis Date  . Hypertension   . Panic attacks     Medications:  Prescriptions prior to admission  Medication Sig Dispense Refill  . diphenhydrAMINE (BENADRYL) 25 mg capsule Take 25 mg by mouth at bedtime as needed.        . hydrochlorothiazide 25 MG tablet Take 25 mg by mouth daily.        . Ibuprofen-Diphenhydramine Cit (ADVIL PM PO) Take 1 tablet by mouth at bedtime as needed. For sleep        Scheduled:    . ALPRAZolam  1 mg Oral Once  . aspirin  324 mg Oral Once  . aspirin EC  81 mg Oral Daily  . docusate sodium  100 mg Oral BID  . enoxaparin  1 mg/kg Subcutaneous Q12H  . heparin  5,000 Units Intravenous Once  . ibuprofen  400 mg Oral Once  . ibuprofen  400 mg Oral TID  . metoprolol tartrate  25 mg Oral TID  . morphine  4 mg Intravenous Once  . nitroGLYCERIN  1 inch Topical Q6H  . nitroGLYCERIN  1 inch Topical Once  . nitroGLYCERIN  5-200 mcg/min Intravenous Once  . potassium chloride  40 mEq Oral Once  . sodium chloride  3 mL Intravenous Q12H  . DISCONTD: heparin        Assessment: Okay for Protocol  Goal of Therapy:  Treatment Dose   Plan:  Lovenox treatment dose.  Check CBC every 3 days.   Mady Gemma 10/22/2010,8:28 AM

## 2010-10-22 NOTE — Plan of Care (Signed)
Problem: Phase I Progression Outcomes Goal: Aspirin unless contraindicated Outcome: Completed/Met Date Met:  10/22/10 324mg  ASA given at 1912 on 10/21/10 in ED

## 2010-10-23 LAB — LIPID PANEL
HDL: 66 mg/dL (ref >39)
LDL Cholesterol: 127 mg/dL — ABNORMAL HIGH (ref 0–99)
Triglycerides: 110 mg/dL (ref <150)
VLDL: 22 mg/dL (ref 0–40)

## 2010-10-23 LAB — CBC
HCT: 41.5 % (ref 36.0–46.0)
Hemoglobin: 13.8 g/dL (ref 12.0–15.0)
RDW: 13.4 % (ref 11.5–15.5)
WBC: 13 10*3/uL — ABNORMAL HIGH (ref 4.0–10.5)

## 2010-10-23 LAB — CARDIAC PANEL(CRET KIN+CKTOT+MB+TROPI): Relative Index: INVALID (ref 0.0–2.5)

## 2010-10-23 MED ORDER — METOPROLOL TARTRATE 25 MG PO TABS: 25.0000 mg | ORAL_TABLET | Freq: Two times a day (BID) | ORAL | Status: DC

## 2010-10-23 MED ORDER — ALPRAZOLAM 0.5 MG PO TABS: 0.5000 mg | ORAL_TABLET | Freq: Two times a day (BID) | ORAL | Status: AC | PRN

## 2010-10-23 MED ORDER — HYDROCHLOROTHIAZIDE 25 MG PO TABS: 25.0000 mg | ORAL_TABLET | Freq: Every day | ORAL | Status: DC

## 2010-10-23 MED ORDER — ASPIRIN 81 MG PO TBEC: 81.0000 mg | DELAYED_RELEASE_TABLET | Freq: Every day | ORAL | Status: AC

## 2010-10-23 NOTE — Progress Notes (Signed)
IV's removed, sites WNL.  Pt given d/c instructions and new prescriptions.  Discussed home care with patient and discussed home medications, patient verbalizes undstanding. F/U appointment in place at health dept, Sept 4th, pt states she will keep appt. Pt taken to main entrance refused wheelchair, ambulated to ED entrance.

## 2010-10-23 NOTE — Discharge Summary (Signed)
  Physician Discharge Summary  Patient ID: Tiffany Barker MRN: [id number] DOB/AGE: 05-13-1960 50 y.o.  Admit date: 10/21/2010 Discharge date: 10/23/2010  Primary Care Physician:  No primary provider on file.   Discharge Diagnoses:    Patient Active Problem List  Diagnoses  . Chest pain  . Accelerated hypertension    Current Discharge Medication List    START taking these medications   Details  ALPRAZolam (XANAX) 0.5 MG tablet Take 1 tablet (0.5 mg total) by mouth 2 (two) times daily as needed for anxiety. Qty: 30 tablet, Refills: 0    aspirin EC 81 MG EC tablet Take 1 tablet (81 mg total) by mouth daily. Qty: 30 tablet, Refills: 11    metoprolol tartrate (LOPRESSOR) 25 MG tablet Take 1 tablet (25 mg total) by mouth 2 (two) times daily. Qty: 60 tablet, Refills: 1      CONTINUE these medications which have CHANGED   Details  hydrochlorothiazide 25 MG tablet Take 1 tablet (25 mg total) by mouth daily. Qty: 30 tablet, Refills: 1      CONTINUE these medications which have NOT CHANGED   Details  diphenhydrAMINE (BENADRYL) 25 mg capsule Take 25 mg by mouth at bedtime as needed.        STOP taking these medications     Ibuprofen-Diphenhydramine Cit (ADVIL PM PO)          Disposition and Follow-up: Patient will be discharged home today in stable and improved condition. She has a followup scheduled at the health department on Tuesday.  Consults:  none    Significant Diagnostic Studies:  Dg Chest Portable 1 View  10/21/2010  *RADIOLOGY REPORT*  Clinical Data: Chest pain and pressure.  Hypertension.  Panic attacks.  PORTABLE CHEST - 1 VIEW  Comparison: 01/11/2010  Findings: Midline trachea.  Normal heart size and mediastinal contours. No pleural effusion or pneumothorax.  Clear lungs.  IMPRESSION: No acute cardiopulmonary disease.  Original Report Authenticated By: Consuello Bossier, M.D.    Brief H and P: For complete details please refer to admission H and P, but in  brief Mrs. Tiffany Barker is a 50 year old white woman admitted to the hospital on August 31 with complaints of chest pain and a "panic attack". She had been off her hydrochlorothiazide for a week because she ran out. She was found to be markedly hypertensive and we're asked to admit her for further evaluation and management.    Hospital Course:  Principal Problem:  *Chest pain Active Problems:  Accelerated hypertension  #1 chest pain: She has ruled out for acute coronary syndrome by the way of 3 sets of negative cardiac enzymes and serial EKGs that didn't demonstrate any acute ST or T wave changes. I believe her chest pain was related to her accelerated hypertension versus panic attack. No further cardiac workup is deemed necessary at this time.  #2 accelerated hypertension: Blood pressure is now well controlled on hydrochlorothiazide and metoprolol. Have given prescriptions.  #3 panic attacks, anxiety disorder: She has a followup at the health Department for Tuesday, which is 2 days after discharge, we'll give her 30 pills of Xanax without refills. This will need to be further discussed with her primary care provider.  Time spent on Discharge: Greater than 30 minutes.  SignedChaya Jan 10/23/2010, 9:49 AM

## 2010-10-25 LAB — MRSA CULTURE

## 2010-11-01 NOTE — Progress Notes (Signed)
Encounter addended by: Angela Lilly on: 11/01/2010  3:17 PM<BR>     Documentation filed: Flowsheet VN

## 2010-11-01 NOTE — Progress Notes (Signed)
Encounter addended by: Ree Shay, RN on: 11/01/2010  1:44 PM<BR>     Documentation filed: Charges VN

## 2010-12-05 ENCOUNTER — Emergency Department (HOSPITAL_COMMUNITY)
Admission: EM | Admit: 2010-12-05 | Discharge: 2010-12-05 | Disposition: A | Discharge status: Home / Self Care | Payer: Self-pay | Attending: Emergency Medicine | Admitting: Emergency Medicine

## 2010-12-05 ENCOUNTER — Other Ambulatory Visit: Payer: Self-pay

## 2010-12-05 ENCOUNTER — Emergency Department (HOSPITAL_COMMUNITY): Payer: Self-pay

## 2010-12-05 ENCOUNTER — Encounter (HOSPITAL_COMMUNITY): Payer: Self-pay | Admitting: *Deleted

## 2010-12-05 DIAGNOSIS — R079 Chest pain, unspecified: Secondary | ICD-10-CM | POA: Insufficient documentation

## 2010-12-05 DIAGNOSIS — Z5320 Procedure and treatment not carried out because of patient's decision for unspecified reasons: Secondary | ICD-10-CM | POA: Insufficient documentation

## 2010-12-05 NOTE — ED Notes (Signed)
Pt called x1 with no answer.  

## 2010-12-05 NOTE — ED Notes (Signed)
Chest pressure that started approx 2:00 today while watching t.v.  Denies n/v/weakness/dizziness.  C/o headache and cough.

## 2010-12-15 ENCOUNTER — Emergency Department (HOSPITAL_COMMUNITY): Payer: Self-pay

## 2010-12-15 ENCOUNTER — Encounter (HOSPITAL_COMMUNITY): Payer: Self-pay | Admitting: *Deleted

## 2010-12-15 ENCOUNTER — Emergency Department (HOSPITAL_COMMUNITY)
Admission: EM | Admit: 2010-12-15 | Discharge: 2010-12-15 | Disposition: A | Discharge status: Home / Self Care | Payer: Self-pay | Attending: Emergency Medicine | Admitting: Emergency Medicine

## 2010-12-15 ENCOUNTER — Other Ambulatory Visit: Payer: Self-pay

## 2010-12-15 DIAGNOSIS — Z87891 Personal history of nicotine dependence: Secondary | ICD-10-CM | POA: Insufficient documentation

## 2010-12-15 DIAGNOSIS — K8020 Calculus of gallbladder without cholecystitis without obstruction: Secondary | ICD-10-CM | POA: Insufficient documentation

## 2010-12-15 DIAGNOSIS — R197 Diarrhea, unspecified: Secondary | ICD-10-CM | POA: Insufficient documentation

## 2010-12-15 DIAGNOSIS — I10 Essential (primary) hypertension: Secondary | ICD-10-CM | POA: Insufficient documentation

## 2010-12-15 DIAGNOSIS — R079 Chest pain, unspecified: Secondary | ICD-10-CM | POA: Insufficient documentation

## 2010-12-15 DIAGNOSIS — R10811 Right upper quadrant abdominal tenderness: Secondary | ICD-10-CM | POA: Insufficient documentation

## 2010-12-15 DIAGNOSIS — K8050 Calculus of bile duct without cholangitis or cholecystitis without obstruction: Secondary | ICD-10-CM

## 2010-12-15 DIAGNOSIS — Z79899 Other long term (current) drug therapy: Secondary | ICD-10-CM | POA: Insufficient documentation

## 2010-12-15 LAB — HEPATIC FUNCTION PANEL
AST: 16 U/L (ref 0–37)
Albumin: 3.9 g/dL (ref 3.5–5.2)
Alkaline Phosphatase: 85 U/L (ref 39–117)
Total Bilirubin: 0.2 mg/dL — ABNORMAL LOW (ref 0.3–1.2)

## 2010-12-15 LAB — DIFFERENTIAL
Basophils Absolute: 0.1 10*3/uL (ref 0.0–0.1)
Basophils Relative: 1 % (ref 0–1)
Eosinophils Absolute: 0.2 10*3/uL (ref 0.0–0.7)
Monocytes Absolute: 1.2 10*3/uL — ABNORMAL HIGH (ref 0.1–1.0)
Neutro Abs: 10.9 10*3/uL — ABNORMAL HIGH (ref 1.7–7.7)

## 2010-12-15 LAB — BASIC METABOLIC PANEL
BUN: 13 mg/dL (ref 6–23)
CO2: 29 mEq/L (ref 19–32)
Chloride: 97 mEq/L (ref 96–112)
Creatinine, Ser: 0.9 mg/dL (ref 0.50–1.10)
Glucose, Bld: 100 mg/dL — ABNORMAL HIGH (ref 70–99)

## 2010-12-15 LAB — CBC
HCT: 42.4 % (ref 36.0–46.0)
MCH: 31.4 pg (ref 26.0–34.0)
MCHC: 32.5 g/dL (ref 30.0–36.0)
RDW: 13.5 % (ref 11.5–15.5)

## 2010-12-15 MED ORDER — SODIUM CHLORIDE 0.9 % IV SOLN: Freq: Once | INTRAVENOUS | Status: DC

## 2010-12-15 MED ORDER — GI COCKTAIL ~~LOC~~
30.0000 mL | Freq: Once | ORAL | Status: AC
  Administered 2010-12-15: 30 mL via ORAL
  Filled 2010-12-15: qty 30

## 2010-12-15 MED ORDER — DICYCLOMINE HCL 20 MG PO TABS: 20.0000 mg | ORAL_TABLET | Freq: Four times a day (QID) | ORAL | Status: DC | PRN

## 2010-12-15 NOTE — ED Notes (Signed)
Pt c/o chest pain that started today around 10:30 am; pt describes the pain as a pressure in the center of her chest with intermittent sharp pains; pt states the pain moves down to epigastric region

## 2010-12-15 NOTE — ED Notes (Signed)
Patients IV was taken out and patient was allowed to get dressed.

## 2010-12-15 NOTE — ED Provider Notes (Signed)
History     CSN: [id number] Arrival date & time: 12/15/2010 12:20 PM   First MD Initiated Contact with Patient 12/15/10 1245      Chief Complaint  Patient presents with  . Chest Pain    Patient is a 50 y.o. female presenting with chest pain. The history is provided by the patient and a friend.  Chest Pain The chest pain began 3 - 5 hours ago. Chest pain occurs intermittently (But usually resolves quickley. This episode is unusual in that it has been going on for hours and is persistent. ). The chest pain is unchanged. The severity of the pain is moderate. The quality of the pain is described as aching, pressure-like and similar to previous episodes (Recently seen here over Labor Day for similar problem. Chest pain attributed to high blood pressure. Was not asked to follow-up with cardiology at that time.). The pain does not radiate. Chest pain is worsened by certain positions (When she leans forward. May be worsened also by eating. Last ate around 9:00 and pain started around 10:30. ). Primary symptoms include abdominal pain. Pertinent negatives for primary symptoms include no fever, no fatigue, no syncope, no shortness of breath, no cough, no wheezing, no palpitations, no nausea, no vomiting, no dizziness and no altered mental status. She tried antacids (Did not help.) for the symptoms. Risk factors include alcohol intake, lack of exercise, obesity and sedentary lifestyle.  Her past medical history is significant for hyperlipidemia and hypertension.  Pertinent negatives for past medical history include no COPD, no CHF, no diabetes, no recent injury and no strokes. Past medical history comments: Had chest pain several years ago. Nuclear stress test around 2006-2007 in [place], [place] showed "mild blockage in one of my arteries". Told nothing needed to be done at that time. Has not seen cardiologist for several years.  Her family medical history is significant for heart disease in family (Mother had  heart attack in her 30s).  Procedure history is positive for stress thallium (Showed "blockage in one of my arteries").      Past Medical History  Diagnosis Date  . Hypertension   . Panic attacks   . Gallstones     Past Surgical History  Procedure Date  . Back surgery   . Tubal ligation     Family History  Problem Relation Age of Onset  . Coronary artery disease Mother   . Coronary artery disease Father     History  Substance Use Topics  . Smoking status: Former Games developer  . Smokeless tobacco: Not on file  . Alcohol Use: Yes     occasionaly    OB History    Grav Para Term Preterm Abortions TAB SAB Ect Mult Living                  Review of Systems  Constitutional: Negative for fever and fatigue.  Respiratory: Negative for cough, shortness of breath and wheezing.   Cardiovascular: Positive for chest pain. Negative for palpitations and syncope.  Gastrointestinal: Positive for abdominal pain. Negative for nausea and vomiting. Diarrhea: A few days ago after eating and drinking heavily the previous days. Symptoms have been resolved for past couple of days.  Genitourinary: Negative for difficulty urinating.  Neurological: Negative for dizziness.  Psychiatric/Behavioral: Negative for altered mental status.    Allergies  Wasp venom and Percocet  Home Medications   Current Outpatient Rx  Name Route Sig Dispense Refill  . ASPIRIN 81 MG PO TBEC Oral Take  1 tablet (81 mg total) by mouth daily. 30 tablet 11  . DIPHENHYDRAMINE HCL 25 MG PO CAPS Oral Take 25 mg by mouth at bedtime as needed.      Marland Kitchen HYDROCHLOROTHIAZIDE 25 MG PO TABS Oral Take 1 tablet (25 mg total) by mouth daily. 30 tablet 1  . METOPROLOL TARTRATE 25 MG PO TABS Oral Take 1 tablet (25 mg total) by mouth 2 (two) times daily. 60 tablet 1    BP 144/85  Pulse 65  Temp(Src) 98.2 F (36.8 C) (Oral)  Resp 20  Ht 5\' 4"  (1.626 m)  Wt 196 lb (88.905 kg)  BMI 33.64 kg/m2  SpO2 96%  LMP 12/05/2010  Physical  Exam  Constitutional: No distress.  HENT:  Head: Normocephalic and atraumatic.  Mouth/Throat: Oropharynx is clear and moist.  Neck: Normal range of motion. Neck supple.  Cardiovascular: Normal rate, normal heart sounds and intact distal pulses.  Exam reveals no gallop and no friction rub.   No murmur heard.      Occasional skipped beats   Pulmonary/Chest: Effort normal and breath sounds normal. No respiratory distress. She has no wheezes. She has no rales. She exhibits tenderness (Moderate substernal tenderness).  Abdominal: Soft. Bowel sounds are normal. Distention: Obese. There is tenderness (Moderate epigastric tenderness and mild tenderness along right upper quadrant). There is no rebound and no guarding.  Skin: She is not diaphoretic.    ED Course  Procedures (including critical care time)  Labs Reviewed  CBC - Abnormal; Notable for the following:    WBC 15.4 (*)    All other components within normal limits  DIFFERENTIAL - Abnormal; Notable for the following:    Neutro Abs 10.9 (*)    Monocytes Absolute 1.2 (*)    All other components within normal limits  BASIC METABOLIC PANEL - Abnormal; Notable for the following:    Glucose, Bld 100 (*)    GFR calc non Af Amer 75 (*)    GFR calc Af Amer 87 (*)    All other components within normal limits  POCT I-STAT TROPONIN I  I-STAT TROPONIN I  HEPATIC FUNCTION PANEL  LIPASE, BLOOD   No results found.   No diagnosis found.   Date: 12/15/2010  Rate: 77  Rhythm: normal sinus rhythm and sinus arrhythmia  QRS Axis: normal  Intervals: normal  ST/T Wave abnormalities: normal  Conduction Disutrbances:none  Narrative Interpretation: occasional PVCs  Old EKG Reviewed: Dec 05 2010 ECG shows sinus arrhythmia with occasional PVCs as well    MDM  This is a 50 YO F presenting with persistent chest pain. She has a history of intermittent chest pain but this episode has lasted several hours whereas her usual episodes last moments. She  was recently worked-up for this chest pain (her current chest pain feels like her usual episodes) in August. ACS was ruled-out and no cardiac follow-up was thought necessary by the cardiologist who consulted on her.  Her risk factors and previous history of possible coronary artery disease is concerning. But she has been ruled-out for ACS with negative troponins and an unchanged ECG from previous. Her chest pain is also relieved by GI cocktail, which is also reassuring.  Case was discussed by Dr. Fredricka Bonine who will be taking over this patient from Dr. Adriana Simas and myself as he starts his shift. Dr. Derrill Memo plan is to check another troponin, which would be more than 6 hours after the onset of chest pain. If negative, patient will be discharged with  close follow-up with her PCP and a referral to a cardiologist for a possible follow-up stress test.   Lucianne [place] Resident 12/18/10 [phone]

## 2010-12-15 NOTE — ED Provider Notes (Signed)
History    Scribed for Felisa Bonier, MD, the patient was seen in room APA09/APA09. This chart was scribed by Katha Cabal.   CSN: [id number] Arrival date & time: 12/15/2010 12:20 PM   First MD Initiated Contact with Patient 12/15/10 1245      Chief Complaint  Patient presents with  . Chest Pain    (Consider location/radiation/quality/duration/timing/severity/associated sxs/prior treatment) HPI  Patient signed over to Dr. Fredricka Bonine please refer to initial note for complete HPI.   Dr. Fredricka Bonine saw patient at 3:55 PM.   Tiffany Barker is a 50 y.o. female who presents to the Emergency Department complaining of intermittent mid chest pain that radiates to epigastric abdomen.  Pain is described as pressured began suddenly around 10:30 AM while patient was sitting on the couch.  Chest pain is described as pressure. Patient reports intermittent SOB and diarrhea.  Denies nausea. Patient reports similar sx previously.  Patient also has a cough that she contributes to her cat allergy.  Patient had a stress test at [facility].  Patient states that a blockage was found that "wasn't bad".  Patient has hx of diverticulitis, HTN, gall stones and panic attacks.        Past Medical History  Diagnosis Date  . Hypertension   . Panic attacks   . Gallstones     Past Surgical History  Procedure Date  . Back surgery   . Tubal ligation     Family History  Problem Relation Age of Onset  . Coronary artery disease Mother   . Coronary artery disease Father     History  Substance Use Topics  . Smoking status: Former Games developer  . Smokeless tobacco: Not on file  . Alcohol Use: Yes     occasionaly    OB History    Grav Para Term Preterm Abortions TAB SAB Ect Mult Living                  Review of Systems Patient signed over to Dr. Fredricka Bonine please refer to original note for ROS.  Allergies  Wasp venom and Percocet  Home Medications   Current Outpatient Rx  Name Route Sig  Dispense Refill  . ALPRAZOLAM 0.5 MG PO TABS Oral Take 0.5 mg by mouth 2 (two) times daily as needed. Anxiety/panic attacks     . ASPIRIN 81 MG PO TBEC Oral Take 1 tablet (81 mg total) by mouth daily. 30 tablet 11  . DIPHENHYDRAMINE HCL 25 MG PO CAPS Oral Take 25 mg by mouth at bedtime as needed. Allergic to her cat    . HYDROCHLOROTHIAZIDE 25 MG PO TABS Oral Take 1 tablet (25 mg total) by mouth daily. 30 tablet 1  . METOPROLOL TARTRATE 25 MG PO TABS Oral Take 1 tablet (25 mg total) by mouth 2 (two) times daily. 60 tablet 1  . PRAVASTATIN SODIUM 10 MG PO TABS Oral Take 10 mg by mouth at bedtime.        BP 144/85  Pulse 65  Temp(Src) 98.2 F (36.8 C) (Oral)  Resp 20  Ht 5\' 4"  (1.626 m)  Wt 196 lb (88.905 kg)  BMI 33.64 kg/m2  SpO2 96%  LMP 12/05/2010  Physical Exam  Constitutional: She is oriented to person, place, and time. She appears well-developed and well-nourished. No distress.  HENT:  Head: Normocephalic and atraumatic.  Eyes: Conjunctivae and EOM are normal.  Cardiovascular: Normal rate, regular rhythm and normal heart sounds.   Pulmonary/Chest: Effort normal and  breath sounds normal. No respiratory distress.  Abdominal: Soft. Bowel sounds are normal. There is tenderness in the right upper quadrant. There is no rebound and no guarding.  Neurological: She is alert and oriented to person, place, and time.  Skin: Skin is warm, dry and intact. She is not diaphoretic.  Psychiatric: She has a normal mood and affect. Her behavior is normal.    ED Course  Procedures (including critical care time)   DIAGNOSTIC STUDIES: Oxygen Saturation is 96% on room air, normal by my interpretation.    COORDINATION OF CARE:    LABS / RADIOLOGY:   Labs Reviewed  CBC - Abnormal; Notable for the following:    WBC 15.4 (*)    All other components within normal limits  DIFFERENTIAL - Abnormal; Notable for the following:    Neutro Abs 10.9 (*)    Monocytes Absolute 1.2 (*)    All other  components within normal limits  BASIC METABOLIC PANEL - Abnormal; Notable for the following:    Glucose, Bld 100 (*)    GFR calc non Af Amer 75 (*)    GFR calc Af Amer 87 (*)    All other components within normal limits  HEPATIC FUNCTION PANEL - Abnormal; Notable for the following:    Total Bilirubin 0.2 (*)    All other components within normal limits  POCT I-STAT TROPONIN I  LIPASE, BLOOD  TROPONIN I  I-STAT TROPONIN I   US Abdomen Complete  12/15/2010  *RADIOLOGY REPORT*  Clinical Data:  50 year old female with abdominal pain.  ABDOMINAL ULTRASOUND COMPLETE  Comparison:  02/26/2010 CT  Findings:  Gallbladder: Multiple small mobile gallstones are identified, the majority measuring 5 mm or less.  There is no evidence of gallbladder wall thickening, pericholecystic fluid or sonographic Murphy's sign.  Common Bile Duct:  There is no evidence of intrahepatic or extrahepatic biliary dilation. The CBD measures 5.8 mm in greatest diameter.  Liver:  The liver is within normal limits in parenchymal echogenicity. No focal abnormalities are identified.  IVC:  Appears normal.  Pancreas:  Although the pancreas is difficult to visualize in its entirety, no focal pancreatic abnormality is identified.  Spleen:  Within normal limits in size and echotexture.  Right kidney:  The right kidney is normal in size and parenchymal echogenicity.  There is no evidence of solid mass, hydronephrosis or definite renal calculi.  The right kidney measures 11.1 cm.  Left kidney:  The left kidney is normal in size and parenchymal echogenicity.  There is no evidence of solid mass, hydronephrosis or definite renal calculi.   The left kidney measures 11.7 cm.  Abdominal Aorta:  No abdominal aortic aneurysm identified.  There is no evidence of ascites.  IMPRESSION: Cholelithiasis without evidence of acute cholecystitis or biliary dilatation.  Please note that pancreatitis may be occult on ultrasound. Correlate with laboratory values.   Otherwise normal abdominal ultrasound.  Original Report Authenticated By: Rosendo Gros, M.D.   Dg Chest Portable 1 View  12/15/2010  *RADIOLOGY REPORT*  Clinical Data: Chest pain  PORTABLE CHEST - 1 VIEW  Comparison: 10/21/2010  Findings: Borderline cardiomegaly noted.  No acute infiltrate or edema.  Stable left basilar atelectasis or scarring.  IMPRESSION: No active disease.  Borderline cardiomegaly.  Original Report Authenticated By: Natasha Mead, M.D.     Results for orders placed during the hospital encounter of 12/15/10  CBC      Component Value Range   WBC 15.4 (*) 4.0 - 10.5 (K/uL)  RBC 4.40  3.87 - 5.11 (MIL/uL)   Hemoglobin 13.8  12.0 - 15.0 (g/dL)   HCT 40.9  81.1 - 91.4 (%)   MCV 96.4  78.0 - 100.0 (fL)   MCH 31.4  26.0 - 34.0 (pg)   MCHC 32.5  30.0 - 36.0 (g/dL)   RDW 78.2  95.6 - 21.3 (%)   Platelets 363  150 - 400 (K/uL)  DIFFERENTIAL      Component Value Range   Neutrophils Relative 71  43 - 77 (%)   Neutro Abs 10.9 (*) 1.7 - 7.7 (K/uL)   Lymphocytes Relative 19  12 - 46 (%)   Lymphs Abs 3.0  0.7 - 4.0 (K/uL)   Monocytes Relative 8  3 - 12 (%)   Monocytes Absolute 1.2 (*) 0.1 - 1.0 (K/uL)   Eosinophils Relative 2  0 - 5 (%)   Eosinophils Absolute 0.2  0.0 - 0.7 (K/uL)   Basophils Relative 1  0 - 1 (%)   Basophils Absolute 0.1  0.0 - 0.1 (K/uL)  BASIC METABOLIC PANEL      Component Value Range   Sodium 136  135 - 145 (mEq/L)   Potassium 3.5  3.5 - 5.1 (mEq/L)   Chloride 97  96 - 112 (mEq/L)   CO2 29  19 - 32 (mEq/L)   Glucose, Bld 100 (*) 70 - 99 (mg/dL)   BUN 13  6 - 23 (mg/dL)   Creatinine, Ser 0.86  0.50 - 1.10 (mg/dL)   Calcium 9.9  8.4 - 57.8 (mg/dL)   GFR calc non Af Amer 75 (*) >90 (mL/min)   GFR calc Af Amer 87 (*) >90 (mL/min)  POCT I-STAT TROPONIN I      Component Value Range   Troponin i, poc 0.00  0.00 - 0.08 (ng/mL)   Comment 3           HEPATIC FUNCTION PANEL      Component Value Range   Total Protein 7.9  6.0 - 8.3 (g/dL)   Albumin 3.9   3.5 - 5.2 (g/dL)   AST 16  0 - 37 (U/L)   ALT 14  0 - 35 (U/L)   Alkaline Phosphatase 85  39 - 117 (U/L)   Total Bilirubin 0.2 (*) 0.3 - 1.2 (mg/dL)   Bilirubin, Direct <4.6  0.0 - 0.3 (mg/dL)   Indirect Bilirubin NOT CALCULATED  0.3 - 0.9 (mg/dL)  LIPASE, BLOOD      Component Value Range   Lipase 32  11 - 59 (U/L)  TROPONIN I      Component Value Range   Troponin I <0.30  <0.30 (ng/mL)       MDM   MDM:      MEDICATIONS GIVEN IN THE E.D. Scheduled Meds:    . sodium chloride   Intravenous Once  . gi cocktail  30 mL Oral Once   Continuous Infusions:    DDX: Likely gastrointestinal chest pain rather than cardiac chest pain.  Will repeat Troponin.    At this time the patient's repeat cardiac markers negative for myocardial ischemia as is her EKG. I do not suspect this to be cardiac etiology of pain, but rather gastrointestinal, specifically biliary colic coming from gallstones that may be intermittently occluding the outflow tract of the gallbladder. At this time on ultrasound there is no apparent choledocholithiasis or cholecystitis. The patient's pain has resolved completely. I will discharge her with a prescription for Bentyl to use as needed if the pain  returns and I will give her followup information to followup with Gen. surgery for evaluation for possible elective cholecystectomy if problems persist. The patient states her understanding of and agreement with the plan of care. IMPRESSION: No diagnosis found.   DISCHARGE MEDICATIONS: New Prescriptions   No medications on file      I personally performed the services described in this documentation, which was scribed in my presence. The recorded information has been reviewed and considered.            Felisa Bonier, MD 12/15/10 [phone]

## 2010-12-15 NOTE — ED Notes (Signed)
Pt states she feels better after GI cocktail pain rated at a 2 on pain scale. States pain feels like pressure but much better than when she first got here.

## 2010-12-21 ENCOUNTER — Encounter: Payer: Self-pay | Admitting: Cardiology

## 2010-12-22 ENCOUNTER — Ambulatory Visit (INDEPENDENT_AMBULATORY_CARE_PROVIDER_SITE_OTHER): Payer: Self-pay | Admitting: Cardiology

## 2010-12-22 ENCOUNTER — Encounter: Payer: Self-pay | Admitting: Cardiology

## 2010-12-22 DIAGNOSIS — I10 Essential (primary) hypertension: Secondary | ICD-10-CM

## 2010-12-22 DIAGNOSIS — E782 Mixed hyperlipidemia: Secondary | ICD-10-CM

## 2010-12-22 DIAGNOSIS — R079 Chest pain, unspecified: Secondary | ICD-10-CM

## 2010-12-22 NOTE — Patient Instructions (Signed)
**Note De-Identified Bracen Schum Obfuscation** Your physician has requested that you have en exercise stress myoview. For further information please visit [email or web address] Please follow instruction sheet, as given.  Your physician recommends that you continue on your current medications as directed. Please refer to the Current Medication list given to you today.  Your physician recommends that you schedule a follow-up appointment in: we will contact you with results of test

## 2010-12-22 NOTE — Assessment & Plan Note (Signed)
Lipid profile within the last few months showed total cholesterol 215, HDL 66, LDL 127. She has been recently placed on low-dose statin therapy.

## 2010-12-22 NOTE — Assessment & Plan Note (Signed)
Blood pressure control is good today on current regimen.

## 2010-12-22 NOTE — Progress Notes (Signed)
Clinical Summary Tiffany Barker is a 50 y.o.female referred for cardiology consultation after recent ER visit. Primary care is with the [facility]. She reports a history of recurrent chest pain over the last few years, more frequent within the last month. She states that she had a nuclear stress test at [facility] approximately 3 years ago that by her recollection was mildly abnormal - details not clear.  She describes two different types of chest pain, one that is a "fullness" in her mid sternal and epigastric region, usually worse after eating. The other is a "tightness" that is associated with emotional stress and sometimes exertion. She reportedly has gallstones and is due to see a Development worker, international aid for evaluation.  Recent ECG was reviewed and is nonspecific overall. Lab work showed normal cardiac markers recently and during hospital stay in August.   Allergies  Allergen Reactions  . Wasp Venom Anaphylaxis  . Percocet (Oxycodone-Acetaminophen) Itching    Medication list reviewed.  Past Medical History  Diagnosis Date  . Essential hypertension, benign   . Panic attacks   . Cholelithiasis   . Diverticulosis     Colonoscopy 2010 Tiffany Barker    Past Surgical History  Procedure Date  . Back surgery   . Tubal ligation   . Chest cyst removal     Family History  Problem Relation Age of Onset  . Coronary artery disease Mother     CAD in her 29's  . Coronary artery disease Father     CAD in his 3's  . Stroke Father     Social History [name] reports that she has quit smoking. Her smoking use included Cigarettes. She has never used smokeless tobacco. Tiffany Barker reports that she drinks alcohol.  Review of Systems No palpitations or syncope. She reports problems with panic attacks. Indicates postprandial abdominal fullness, no nausea or emesis. Otherwise negative.  Physical Examination Filed Vitals:   12/22/10 0851  BP: 120/81  Pulse:  88  Resp: 18   Overweight woman in no acute distress. HEENT: Conjunctiva and lids normal, oropharynx with moist mucosa. Neck: Supple, no elevated JVP or carotid bruits, no thyromegaly. Lungs: Clear to auscultation, nonlabored. Cardiac: Regular rate and rhythm, no S3 gallop or rub. Abdomen: Soft, nontender, no guarding or rebound. Skin: Warm and dry. Extremities: No pitting edema, distal pulses full. Musculoskeletal: No kyphosis. Neuropsychiatric: Alert and oriented x3, affect grossly appropriate.   Problem List and Plan

## 2010-12-22 NOTE — Assessment & Plan Note (Signed)
Describes differing types of chest pain with typical and atypical features. Also reports a mildly abnormal stress test approximately 3 years ago. Cardiac risk factors include hypertension, mild hyperlipidemia, and family history of premature CAD. Her personal Framingham ten-year risk at this point is in the low range however. Recent ECG and cardiac markers reassuring. Plan is to request records regarding the patient's prior cardiac evaluation at [facility], and also proceed with an exercise Myoview for additional risk stratification in light of progressive symptoms, and potential need for upcoming gallbladder surgery.

## 2010-12-22 NOTE — Progress Notes (Signed)
Records requested from [facility]. We received a fax from them stating that they have no cardiac records on patient./LV

## 2010-12-23 ENCOUNTER — Encounter: Payer: Self-pay | Admitting: Cardiology

## 2010-12-29 ENCOUNTER — Encounter (HOSPITAL_COMMUNITY)
Admission: RE | Admit: 2010-12-29 | Discharge: 2010-12-29 | Disposition: A | Discharge status: Home / Self Care | Payer: Self-pay | Source: Ambulatory Visit | Attending: Cardiology | Admitting: Cardiology

## 2010-12-29 ENCOUNTER — Encounter (HOSPITAL_COMMUNITY): Payer: Self-pay

## 2010-12-29 ENCOUNTER — Ambulatory Visit (INDEPENDENT_AMBULATORY_CARE_PROVIDER_SITE_OTHER): Payer: Self-pay | Admitting: *Deleted

## 2010-12-29 ENCOUNTER — Encounter (HOSPITAL_COMMUNITY): Payer: Self-pay | Admitting: Cardiology

## 2010-12-29 DIAGNOSIS — R079 Chest pain, unspecified: Secondary | ICD-10-CM

## 2010-12-29 DIAGNOSIS — I10 Essential (primary) hypertension: Secondary | ICD-10-CM | POA: Insufficient documentation

## 2010-12-29 MED ORDER — TECHNETIUM TC 99M TETROFOSMIN IV KIT
30.0000 | PACK | Freq: Once | INTRAVENOUS | Status: AC | PRN
  Administered 2010-12-29: 30 via INTRAVENOUS

## 2010-12-29 MED ORDER — TECHNETIUM TC 99M TETROFOSMIN IV KIT
10.0000 | PACK | Freq: Once | INTRAVENOUS | Status: AC | PRN
  Administered 2010-12-29: 10 via INTRAVENOUS

## 2010-12-29 NOTE — Progress Notes (Signed)
Stress Lab Nurses Notes - Tiffany Barker  Tiffany Barker 12/29/2010  Reason for doing test: Chest Pain  Type of test: Stress Myoview  Nurse performing test: Parke Poisson, RN  Nuclear Medicine Tech: Lyndel Pleasure  Echo Tech: Not Applicable  MD performing test: Ival Bible  Family MD: [place]  Test explained and consent signed: yes  IV started: 22g jelco, Saline lock flushed, No redness or edema and Saline lock started in radiology  Symptoms: SOB and fatigue. Had some dizziness in recovery.  Treatment/Intervention: None  Reason test stopped: fatigue and reached target HR  After recovery IV was: Discontinued via X-ray tech and No redness or edema  Patient to return to Nuc. Med at : 12:15pm  Patient discharged: Home  Patient's Condition upon discharge was: stable  Comments: During test peak BP 178/78 & HR 158.  Recovery BP 128/80 & HR 88.  Symptoms resolved in recovery.  Erskine Speed T

## 2011-01-06 ENCOUNTER — Other Ambulatory Visit: Payer: Self-pay | Admitting: Cardiology

## 2011-01-06 ENCOUNTER — Encounter: Payer: Self-pay | Admitting: Cardiology

## 2011-01-06 ENCOUNTER — Ambulatory Visit (INDEPENDENT_AMBULATORY_CARE_PROVIDER_SITE_OTHER): Payer: Self-pay | Admitting: Cardiology

## 2011-01-06 DIAGNOSIS — R9430 Abnormal result of cardiovascular function study, unspecified: Secondary | ICD-10-CM

## 2011-01-06 DIAGNOSIS — R079 Chest pain, unspecified: Secondary | ICD-10-CM

## 2011-01-06 DIAGNOSIS — Z01818 Encounter for other preprocedural examination: Secondary | ICD-10-CM

## 2011-01-06 DIAGNOSIS — Z7901 Long term (current) use of anticoagulants: Secondary | ICD-10-CM

## 2011-01-06 DIAGNOSIS — R0602 Shortness of breath: Secondary | ICD-10-CM

## 2011-01-06 DIAGNOSIS — E782 Mixed hyperlipidemia: Secondary | ICD-10-CM

## 2011-01-06 DIAGNOSIS — I10 Essential (primary) hypertension: Secondary | ICD-10-CM

## 2011-01-06 NOTE — Patient Instructions (Signed)
**Note De-Identified Tiffany Barker Obfuscation** Your physician recommends that you return for lab work in: today  Your physician has requested that you have a cardiac catheterization. Cardiac catheterization is used to diagnose and/or treat various heart conditions. Doctors may recommend this procedure for a number of different reasons. The most common reason is to evaluate chest pain. Chest pain can be a symptom of coronary artery disease (CAD), and cardiac catheterization can show whether plaque is narrowing or blocking your heart's arteries. This procedure is also used to evaluate the valves, as well as measure the blood flow and oxygen levels in different parts of your heart. For further information please visit [email or web address] Please follow instruction sheet, as given.  Your physician recommends that you schedule a follow-up appointment in: about 3 weeks (after cath)

## 2011-01-06 NOTE — Progress Notes (Signed)
Clinical Summary Tiffany Barker is a 50 y.o.female presenting for followup. She was seen recently with chest pain in the setting of hypertension and hyperlipidemia. We did request prior records from [facility], however were informed that they had no specific cardiac testing on Tiffany Barker.  Exercise Myoview was arranged, which demonstrated abnormal ST segment changes with exertional chest pain and hypertensive response, although perfusion imaging showed no clear evidence of ischemia with soft tissue attenuation and normal LVEF. We discussed these results today.  Tiffany Barker still complains of exertional chest pain and dyspnea and exertion, now indicating NYHA class III symptoms. She admits also that she has gained weight and has not been exercising regularly.  In light of her reported symptoms, and somewhat equivocal results on stress testing, we discussed the possibility of proceeding with a diagnostic cardiac catheterization to most clearly define her coronary anatomy and assess for any potential revascularization options that might be of benefit. After discussing the risks and benefits, she is in agreement to proceed. She states that she had already discussed this possibility with her father prior to today's visit.   Allergies  Allergen Reactions  . Wasp Venom Anaphylaxis  . Percocet (Oxycodone-Acetaminophen) Itching    Medication list reviewed.  Past Medical History  Diagnosis Date  . Essential hypertension, benign   . Panic attacks   . Cholelithiasis   . Diverticulosis     Colonoscopy 2010 Tiffany Barker    Past Surgical History  Procedure Date  . Back surgery   . Tubal ligation   . Chest cyst removal     Family History  Problem Relation Age of Onset  . Coronary artery disease Mother     CAD in her 33's  . Coronary artery disease Father     CAD in his 62's  . Stroke Father     Social History Tiffany Barker reports that she quit smoking about 30 years ago.  Her smoking use included Cigarettes. She has never used smokeless tobacco. Tiffany Barker reports that she drinks alcohol.  Review of Systems As outlined above, otherwise negative.  Physical Examination Filed Vitals:   01/06/11 1346  BP: 131/84  Pulse: 77    Overweight woman in no acute distress.  HEENT: Conjunctiva and lids normal, oropharynx with moist mucosa.  Neck: Supple, no elevated JVP or carotid bruits, no thyromegaly.  Lungs: Clear to auscultation, nonlabored.  Cardiac: Regular rate and rhythm, no S3 gallop or rub.  Abdomen: Soft, nontender, no guarding or rebound.  Skin: Warm and dry.  Extremities: No pitting edema, distal pulses full.  Musculoskeletal: No kyphosis.  Neuropsychiatric: Alert and oriented x3, affect grossly appropriate.     Problem List and Plan

## 2011-01-06 NOTE — Assessment & Plan Note (Signed)
Patient did continue to report exertional chest pain and shortness of breath, cardiac risk factors include hypertension which has been reportedly more difficult to control, also family history of premature cardiovascular disease. We were not able to obtain any of the patient's prior testing for comparison, however her most recent exercise Myoview showed ECG abnormalities in the setting of chest pain and hypertension, although no definite evidence of ischemia by perfusion imaging. Further testing is being arranged to clarify and exclude the presence of underlying obstructive CAD that might require further intervention.

## 2011-01-06 NOTE — Assessment & Plan Note (Signed)
As outlined above, plan will be to proceed with a diagnostic cardiac catheterization to clarify. Risks and benefits were discussed with the patient and she is in agreement to proceed.

## 2011-01-06 NOTE — Assessment & Plan Note (Signed)
Patient continues on Pravachol. 

## 2011-01-06 NOTE — Assessment & Plan Note (Signed)
No changes of present regimen at this point.

## 2011-01-07 LAB — BASIC METABOLIC PANEL
BUN: 13 mg/dL (ref 6–23)
CO2: 28 mEq/L (ref 19–32)
Chloride: 100 mEq/L (ref 96–112)
Creat: 0.98 mg/dL (ref 0.50–1.10)
Potassium: 3.8 mEq/L (ref 3.5–5.3)

## 2011-01-07 LAB — CBC WITH DIFFERENTIAL/PLATELET
Basophils Relative: 1 % (ref 0–1)
HCT: 40 % (ref 36.0–46.0)
Hemoglobin: 12.9 g/dL (ref 12.0–15.0)
Lymphs Abs: 2.8 10*3/uL (ref 0.7–4.0)
MCH: 31.1 pg (ref 26.0–34.0)
MCHC: 32.3 g/dL (ref 30.0–36.0)
Monocytes Absolute: 1 10*3/uL (ref 0.1–1.0)
Monocytes Relative: 8 % (ref 3–12)
Neutro Abs: 8.5 10*3/uL — ABNORMAL HIGH (ref 1.7–7.7)
RBC: 4.15 MIL/uL (ref 3.87–5.11)

## 2011-01-07 LAB — APTT: aPTT: 29 seconds (ref 24–37)

## 2011-01-09 ENCOUNTER — Inpatient Hospital Stay (HOSPITAL_BASED_OUTPATIENT_CLINIC_OR_DEPARTMENT_OTHER)
Admission: RE | Admit: 2011-01-09 | Discharge: 2011-01-09 | Disposition: A | Discharge status: Home / Self Care | Payer: Self-pay | Source: Ambulatory Visit | Attending: Cardiology | Admitting: Cardiology

## 2011-01-09 ENCOUNTER — Encounter (HOSPITAL_BASED_OUTPATIENT_CLINIC_OR_DEPARTMENT_OTHER)
Admission: RE | Disposition: A | Discharge status: Home / Self Care | Payer: Self-pay | Source: Ambulatory Visit | Attending: Cardiology

## 2011-01-09 DIAGNOSIS — R079 Chest pain, unspecified: Secondary | ICD-10-CM | POA: Insufficient documentation

## 2011-01-09 DIAGNOSIS — I10 Essential (primary) hypertension: Secondary | ICD-10-CM | POA: Insufficient documentation

## 2011-01-09 DIAGNOSIS — R9430 Abnormal result of cardiovascular function study, unspecified: Secondary | ICD-10-CM

## 2011-01-09 DIAGNOSIS — E785 Hyperlipidemia, unspecified: Secondary | ICD-10-CM | POA: Insufficient documentation

## 2011-01-09 DIAGNOSIS — R0602 Shortness of breath: Secondary | ICD-10-CM

## 2011-01-09 DIAGNOSIS — I200 Unstable angina: Secondary | ICD-10-CM

## 2011-01-09 SURGERY — JV LEFT HEART CATHETERIZATION WITH CORONARY ANGIOGRAM
Anesthesia: Moderate Sedation

## 2011-01-09 MED ORDER — DIAZEPAM 5 MG PO TABS: 5.0000 mg | ORAL_TABLET | ORAL | Status: DC

## 2011-01-09 MED ORDER — SODIUM CHLORIDE 0.9 % IV SOLN: INTRAVENOUS | Status: DC

## 2011-01-09 MED ORDER — ASPIRIN 81 MG PO CHEW: 324.0000 mg | CHEWABLE_TABLET | ORAL | Status: DC

## 2011-01-09 MED ORDER — ONDANSETRON HCL 4 MG/2ML IJ SOLN: 4.0000 mg | Freq: Four times a day (QID) | INTRAMUSCULAR | Status: DC | PRN

## 2011-01-09 MED ORDER — SODIUM CHLORIDE 0.9 % IV SOLN: 1.0000 mL/kg/h | INTRAVENOUS | Status: DC

## 2011-01-09 NOTE — H&P (View-Only) (Signed)
Clinical Summary Tiffany Barker is a 50 y.o.female presenting for followup. She was seen recently with chest pain in the setting of hypertension and hyperlipidemia. We did request prior records from [facility], however were informed that they had no specific cardiac testing on Tiffany Barker.  Exercise Myoview was arranged, which demonstrated abnormal ST segment changes with exertional chest pain and hypertensive response, although perfusion imaging showed no clear evidence of ischemia with soft tissue attenuation and normal LVEF. We discussed these results today.  Tiffany Barker still complains of exertional chest pain and dyspnea and exertion, now indicating NYHA class III symptoms. She admits also that she has gained weight and has not been exercising regularly.  In light of her reported symptoms, and somewhat equivocal results on stress testing, we discussed the possibility of proceeding with a diagnostic cardiac catheterization to most clearly define her coronary anatomy and assess for any potential revascularization options that might be of benefit. After discussing the risks and benefits, she is in agreement to proceed. She states that she had already discussed this possibility with her father prior to today's visit.   Allergies  Allergen Reactions  . Wasp Venom Anaphylaxis  . Percocet (Oxycodone-Acetaminophen) Itching    Medication list reviewed.  Past Medical History  Diagnosis Date  . Essential hypertension, benign   . Panic attacks   . Cholelithiasis   . Diverticulosis     Colonoscopy 2010 - [place]    Past Surgical History  Procedure Date  . Back surgery   . Tubal ligation   . Chest cyst removal     Family History  Problem Relation Age of Onset  . Coronary artery disease Mother     CAD in her 30's  . Coronary artery disease Father     CAD in his 50's  . Stroke Father     Social History Tiffany Barker reports that she quit smoking about 30 years ago.  Her smoking use included Cigarettes. She has never used smokeless tobacco. Tiffany Barker reports that she drinks alcohol.  Review of Systems As outlined above, otherwise negative.  Physical Examination Filed Vitals:   01/06/11 1346  BP: 131/84  Pulse: 77    Overweight woman in no acute distress.  HEENT: Conjunctiva and lids normal, oropharynx with moist mucosa.  Neck: Supple, no elevated JVP or carotid bruits, no thyromegaly.  Lungs: Clear to auscultation, nonlabored.  Cardiac: Regular rate and rhythm, no S3 gallop or rub.  Abdomen: Soft, nontender, no guarding or rebound.  Skin: Warm and dry.  Extremities: No pitting edema, distal pulses full.  Musculoskeletal: No kyphosis.  Neuropsychiatric: Alert and oriented x3, affect grossly appropriate.     Problem List and Plan   

## 2011-01-09 NOTE — OR Nursing (Signed)
Discharge instructions completed, vss, ambulated to bathroom without bleeding from right groin site.  Discharged to home via wheelchair.

## 2011-01-09 NOTE — OR Nursing (Signed)
Bedrest begins @ 1045.

## 2011-01-09 NOTE — Op Note (Signed)
Cardiac Catheterization Procedure Note  Name: Tiffany Barker MRN: [id number] DOB: 27-Jul-1960  Procedure: Left Heart Cath, Selective Coronary Angiography, LV angiography  Indication:   Chest pain.   Procedural details: The right groin was prepped, draped, and anesthetized with 1% lidocaine. Using modified Seldinger technique, a 4 French sheath was introduced into the right femoral artery. Standard Judkins catheters were used for coronary angiography and left ventriculography. Catheter exchanges were performed over a guidewire. There were no immediate procedural complications. The patient was transferred to the post catheterization recovery area for further monitoring.  Procedural Findings:  Hemodynamics:     AO 111/65    LV 113/20   Coronary angiography:  Coronary dominance: Right  Left mainstem:   Normal  Left anterior descending (LAD):   Wraps the apex and normal throughout its course. First diagonal small normal. Second diagonal small normal.  Left circumflex (LCx):  AV groove normal. Small ramus intermediate normal. Large first obtuse marginal normal.  Right coronary artery (RCA):  Dominant but somewhat small vessel normal throughout its course. Small PDA normal.  Left ventriculography: Left ventricular systolic function is normal, LVEF is estimated at 55-65%, there is no significant mitral regurgitation   Final Conclusions:  Normal coronary arteries. Well-preserved ejection fraction.  Recommendations:   No further cardiac workup. The patient will follow with her primary physician for workup of nonanginal chest pain.  Rollene Rotunda 01/09/2011, 10:32 AM

## 2011-01-09 NOTE — Interval H&P Note (Signed)
History and Physical Interval Note:   01/09/2011   10:03 AM   Tiffany Barker  has presented today for surgery, with the diagnosis of abnormal stress test and CP  The various methods of treatment have been discussed with the patient and family. After consideration of risks, benefits and other options for treatment, the patient has consented to  Procedure(s): JV LEFT HEART CATHETERIZATION WITH CORONARY ANGIOGRAM as a surgical intervention .  The patients' history has been reviewed, patient examined, no change in status, stable for surgery.  I have reviewed the patients' chart and labs.  Questions were answered to the patient's satisfaction.     Rollene Rotunda  MD

## 2011-01-10 ENCOUNTER — Encounter (HOSPITAL_BASED_OUTPATIENT_CLINIC_OR_DEPARTMENT_OTHER): Payer: Self-pay

## 2011-04-08 ENCOUNTER — Other Ambulatory Visit: Payer: Self-pay

## 2011-04-08 ENCOUNTER — Emergency Department (HOSPITAL_COMMUNITY)
Admission: EM | Admit: 2011-04-08 | Discharge: 2011-04-08 | Disposition: A | Discharge status: Home / Self Care | Payer: Self-pay | Attending: Emergency Medicine | Admitting: Emergency Medicine

## 2011-04-08 ENCOUNTER — Encounter (HOSPITAL_COMMUNITY): Payer: Self-pay | Admitting: *Deleted

## 2011-04-08 DIAGNOSIS — R05 Cough: Secondary | ICD-10-CM | POA: Insufficient documentation

## 2011-04-08 DIAGNOSIS — R059 Cough, unspecified: Secondary | ICD-10-CM | POA: Insufficient documentation

## 2011-04-08 DIAGNOSIS — R0789 Other chest pain: Secondary | ICD-10-CM | POA: Insufficient documentation

## 2011-04-08 DIAGNOSIS — I10 Essential (primary) hypertension: Secondary | ICD-10-CM | POA: Insufficient documentation

## 2011-04-08 DIAGNOSIS — R9431 Abnormal electrocardiogram [ECG] [EKG]: Secondary | ICD-10-CM | POA: Insufficient documentation

## 2011-04-08 DIAGNOSIS — F410 Panic disorder [episodic paroxysmal anxiety] without agoraphobia: Secondary | ICD-10-CM | POA: Insufficient documentation

## 2011-04-08 DIAGNOSIS — R51 Headache: Secondary | ICD-10-CM | POA: Insufficient documentation

## 2011-04-08 DIAGNOSIS — Z87891 Personal history of nicotine dependence: Secondary | ICD-10-CM | POA: Insufficient documentation

## 2011-04-08 MED ORDER — SODIUM CHLORIDE 0.9 % IV BOLUS (SEPSIS)
1000.0000 mL | Freq: Once | INTRAVENOUS | Status: AC
  Administered 2011-04-08: 1000 mL via INTRAVENOUS

## 2011-04-08 MED ORDER — SODIUM CHLORIDE 0.9 % IV SOLN
INTRAVENOUS | Status: DC
  Administered 2011-04-08: 19:00:00 via INTRAVENOUS

## 2011-04-08 MED ORDER — METOCLOPRAMIDE HCL 5 MG/ML IJ SOLN
10.0000 mg | Freq: Once | INTRAMUSCULAR | Status: AC
  Administered 2011-04-08: 10 mg via INTRAVENOUS
  Filled 2011-04-08: qty 2

## 2011-04-08 MED ORDER — DM-GUAIFENESIN ER 30-600 MG PO TB12
ORAL_TABLET | ORAL | Status: AC
  Filled 2011-04-08: qty 1

## 2011-04-08 MED ORDER — LORAZEPAM 2 MG/ML IJ SOLN
1.0000 mg | Freq: Once | INTRAMUSCULAR | Status: AC
  Administered 2011-04-08: 1 mg via INTRAVENOUS
  Filled 2011-04-08: qty 1

## 2011-04-08 MED ORDER — DM-GUAIFENESIN ER 30-600 MG PO TB12
1.0000 | ORAL_TABLET | Freq: Two times a day (BID) | ORAL | Status: DC
  Administered 2011-04-08: 1 via ORAL

## 2011-04-08 MED ORDER — DIPHENHYDRAMINE HCL 50 MG/ML IJ SOLN
50.0000 mg | Freq: Once | INTRAMUSCULAR | Status: AC
  Administered 2011-04-08: 50 mg via INTRAVENOUS
  Filled 2011-04-08: qty 1

## 2011-04-08 MED ORDER — LORAZEPAM 1 MG PO TABS
1.0000 mg | ORAL_TABLET | Freq: Once | ORAL | Status: AC
Start: 2011-04-08 — End: 2011-04-08
  Administered 2011-04-08: 1 mg via ORAL
  Filled 2011-04-08: qty 1

## 2011-04-08 NOTE — Discharge Instructions (Signed)
Go home and rest. Go to mental health to discuss your anxiety medications. There may be something that would control your anxiety better. Recheck as needed

## 2011-04-08 NOTE — ED Notes (Signed)
Resting sitting up in bed. Denies needs. No distress. Equal chest rise and fall. Chest pain 1\10. Call bell within reach. Awaiting to be discharged.

## 2011-04-08 NOTE — ED Notes (Signed)
MD at bedside to evaluate. Patient in no distress. Equal chest rise and fall, unlabored, regular. Call bell within reach.

## 2011-04-08 NOTE — ED Notes (Signed)
Into room to see patient. Resting sitting up in bed. More relaxed. Denies headache. Chest pain 1\10. Equal chest rise and fall. No distress. Denies needs. Will continue to monitor.

## 2011-04-08 NOTE — ED Notes (Signed)
Went to medicate patient with ativan 1mg . Patient refusing medication IV. Requesting anxiety medication PO. MD aware.

## 2011-04-08 NOTE — ED Notes (Signed)
Patient states her chest pain and headache is getting better. Denies any needs. No distress. Call bell within reach. Will continue to monitor. Normal sinus on monitor.

## 2011-04-08 NOTE — ED Notes (Signed)
Patient ambulatory to bathroom with steady gait. Pain 3\10. No shortness of breath. Denies needs.

## 2011-04-08 NOTE — ED Notes (Signed)
Upon pushing medications slow ivp, patient became very anxious. States she felt like she is going to pass out and pain came back in middle of chest. Instructed patient ways to relax. Breathing deep and slow in through nose and out through mouth. Attempting to relax.

## 2011-04-08 NOTE — ED Notes (Signed)
MD at bedside to speak with patient about plan of care.

## 2011-04-08 NOTE — ED Provider Notes (Cosign Needed)
History     CSN: [id number]  Arrival date & time 04/08/11  1647   First MD Initiated Contact with Patient 04/08/11 1734      Chief Complaint  Patient presents with  . Headache    (Consider location/radiation/quality/duration/timing/severity/associated sxs/prior treatment) HPI  Patient relates he has a history of headaches and states are usually around her eyes or the back of her head. She relates about 12:30 this evening she was in [place] and having to stay in the checkout line. She relates as always makes her very anxious and she normally has to leave. She states she had acute onset of feeling like her left eye twisted and which and then slammed shut and started having some clear drainage from her eye she then had pain around her eye that radiated to the back of her head and down into her neck. She relates only thing different from this in her usual headache is the discomfort going into her neck. She relates she went outside and sat in her car for about 30 minutes and then was able to drive home. She states she laid down and thought she was fine but when she stood that she felt dizzy. She states her daughter advised her to come to the ER. Patient drove herself to the ER and when she arrived she started getting chest pain which he relates to having a panic attack because she doesn't like coming to the hospital. She states the center of her chest is painful and it is a hot burning feeling. She relates that the pain sort of comes and goes and when I was in the room and was bearing as we talked it went away. She relates she's been having worsening headaches over the past 4 days. She also states she's had a dry cough for the past 3-4 days without fever. She denies nausea, vomiting, but she does state her vision seems more blurred and she is having some photophobia. Patient relates she normally takes Xanax twice a day however she did not take her afternoon dose of Xanax today.  PCP rocking him [facility]  health Department cardiologist Dr. Diona Browner patient states she had a recent cardiac cath that showed no blockages   Past Medical History  Diagnosis Date  . Essential hypertension, benign   . Panic attacks   . Cholelithiasis   . Diverticulosis     Colonoscopy 2010 Jinny Blossom [place]    Past Surgical History  Procedure Date  . Back surgery   . Tubal ligation   . Chest cyst removal     Family History  Problem Relation Age of Onset  . Coronary artery disease Mother     CAD in her 25's  . Coronary artery disease Father     CAD in his 6's  . Stroke Father     History  Substance Use Topics  . Smoking status: Former Smoker    Types: Cigarettes    Quit date: 02/21/1980  . Smokeless tobacco: Never Used  . Alcohol Use: Yes     Occasionaly   lives alone  OB History    Grav Para Term Preterm Abortions TAB SAB Ect Mult Living                  Review of Systems  All other systems reviewed and are negative.    Allergies  Wasp venom and Percocet  Home Medications   Current Outpatient Rx  Name Route Sig Dispense Refill  . ALPRAZOLAM 0.5 MG  PO TABS Oral Take 0.5 mg by mouth 2 (two) times daily as needed. Anxiety/panic attacks     . ASPIRIN 81 MG PO TBEC Oral Take 1 tablet (81 mg total) by mouth daily. 30 tablet 11  . DIPHENHYDRAMINE HCL 25 MG PO CAPS Oral Take 25 mg by mouth at bedtime as needed. Allergic to her cat    . HYDROCHLOROTHIAZIDE 25 MG PO TABS Oral Take 1 tablet (25 mg total) by mouth daily. 30 tablet 1  . METOPROLOL TARTRATE 25 MG PO TABS Oral Take 1 tablet (25 mg total) by mouth 2 (two) times daily. 60 tablet 1  . PRAVASTATIN SODIUM 10 MG PO TABS Oral Take 10 mg by mouth at bedtime.      Marland Kitchen RANITIDINE HCL 150 MG PO TABS Oral Take 150 mg by mouth 2 (two) times daily.      Marland Kitchen ZOLPIDEM TARTRATE 10 MG PO TABS Oral Take 10 mg by mouth at bedtime as needed. sleep      BP 149/100  Pulse 92  Temp(Src) 98.7 F (37.1 C) (Oral)  Resp 20  Ht 5\' 4"  (1.626 m)  Wt 200  lb (90.719 kg)  BMI 34.33 kg/m2  SpO2 97%  LMP 04/07/2011  Vital signs normal    Physical Exam  Constitutional: She is oriented to person, place, and time. She appears well-developed and well-nourished. She is cooperative.  HENT:  Head: Normocephalic and atraumatic.  Right Ear: External ear normal.  Left Ear: External ear normal.  Nose: Nose normal.  Mouth/Throat: Oropharynx is clear and moist.  Eyes: Conjunctivae and EOM are normal. Pupils are equal, round, and reactive to light.  Neck: Normal range of motion and full passive range of motion without pain. Neck supple.  Cardiovascular: Normal rate, regular rhythm and normal heart sounds.   Pulmonary/Chest: Effort normal. Not tachypneic. No respiratory distress. She has no wheezes. She has no rales. She exhibits no tenderness.       Patient coughing frequently and has some diminished breath sounds.  Musculoskeletal: Normal range of motion. She exhibits no edema and no tenderness.  Neurological: She is alert and oriented to person, place, and time. She has normal strength and normal reflexes. No cranial nerve deficit.  Skin: Skin is warm, dry and intact.  Psychiatric: Her speech is normal and behavior is normal. Thought content normal.       Anxious patient states she cried when they started her IV    ED Course  Procedures (including critical care time)  She refusing albuterol nebulizer because "it makes my heart race" she states she has an inhaler but she will not use.  Patient's headache although she states is different sounds very similar to her prior headaches, the only difference is she has more pain going into her neck.  Pt had cardiac cath on 01/09/1011 which had no CAD with normal EF of 55-65%, she was felt to have noncardiac chest pain.    Medications  0.9 %  sodium chloride infusion (  Intravenous New Bag/Given 04/08/11 1928)  dextromethorphan-guaiFENesin [facility] DM) 30-600 MG per 12 hr tablet 1 tablet (1 tablet Oral  Given 04/08/11 1900)  zolpidem (AMBIEN) 10 MG tablet (not administered)  sodium chloride 0.9 % bolus 1,000 mL (1000 mL Intravenous Given 04/08/11 1835)  metoCLOPramide (REGLAN) injection 10 mg (10 mg Intravenous Given 04/08/11 1837)  diphenhydrAMINE (BENADRYL) injection 50 mg (50 mg Intravenous Given 04/08/11 1836)  LORazepam (ATIVAN) injection 1 mg (1 mg Intravenous Given 04/08/11 1900)  LORazepam (  ATIVAN) tablet 1 mg (1 mg Oral Given 04/08/11 1929)   20:20 Pt relates her headache and chest pain are gone. Ready to go home, will call someone to come get her.     Date: 04/08/2011  Rate: 76  Rhythm: normal sinus rhythm  QRS Axis: normal  Intervals: QT prolonged  ST/T Wave abnormalities: nonspecific ST/T changes  Conduction Disutrbances:none  Narrative Interpretation: PVC's  Old EKG Reviewed: none available    1. Headache   2. Non-cardiac chest pain   3. Panic attack    Plan discharge Devoria Albe, MD, FACEP   MDM          Ward Givens, MD 04/08/11 2041

## 2011-04-08 NOTE — ED Notes (Signed)
Pt states that she had sudden onset headache and swelling to her left eye with discharge. Pt now c/o chest pain with pressure in her left arm, nausea and dizziness. Pt also c/o a knot in her left shoulder.

## 2011-04-08 NOTE — ED Notes (Signed)
Patient states she was standing in [employer] around 1200 when she started getting a bad headache in the back of her head. Eyes started watering. States pain was a burning pain across back of head from neck. Went down into left shoulder. Chest started hurting upon arrival to ED. States she feels like it is like a previous panic attack. Was in middle of chest-- a pressure that comes and goes. Calming down makes it better. Nothing makes it worse. Clear lung sounds in all fields. S1 & S2 present. Denies nausea, vomiting, diarrhea. Denies any chest pain at this time. Headache 6\10. Pupils 2 mm bilaterally. Reactive to light. States light made headache worse when checking pupils. Denies any needs. Call bell at bedside. Awaiting md eval. Will continue to monitor.

## 2011-04-08 NOTE — ED Notes (Signed)
Patient more calm. Equal chest rise and fall, regular, unlabored. Call bell within reach. Denies pain.

## 2011-04-18 ENCOUNTER — Ambulatory Visit (INDEPENDENT_AMBULATORY_CARE_PROVIDER_SITE_OTHER): Payer: Self-pay | Admitting: Adult Health

## 2011-04-18 ENCOUNTER — Encounter: Payer: Self-pay | Admitting: Adult Health

## 2011-04-18 DIAGNOSIS — K8020 Calculus of gallbladder without cholecystitis without obstruction: Secondary | ICD-10-CM

## 2011-04-18 DIAGNOSIS — R109 Unspecified abdominal pain: Secondary | ICD-10-CM

## 2011-04-18 DIAGNOSIS — R079 Chest pain, unspecified: Secondary | ICD-10-CM

## 2011-04-18 DIAGNOSIS — K224 Dyskinesia of esophagus: Secondary | ICD-10-CM

## 2011-04-18 NOTE — Progress Notes (Signed)
   HPI: Tiffany Barker is a 51 y/o female patient of Dr. Diona Browner we are seeing for chest discomfort. She had a cardiac cath in 01/09/2011 demonstratring normal coronary arteries. She was told to come back prn. She was seen in the ER on 2/16/ 2012 for complaints of chest pain and headache. She was diagnosed with noncardiac chest pain and anxiety. [facility] and was started on Buspar. She comes today for more recommendations.    Allergies  Allergen Reactions  . Wasp Venom Anaphylaxis  . Percocet (Oxycodone-Acetaminophen) Itching    Current Outpatient Prescriptions  Medication Sig Dispense Refill  . aspirin EC 81 MG EC tablet Take 1 tablet (81 mg total) by mouth daily.  30 tablet  11  . busPIRone (BUSPAR) 5 MG tablet Take 5 mg by mouth daily.      . cyclobenzaprine (FLEXERIL) 10 MG tablet Take 10 mg by mouth daily.      Marland Kitchen dicyclomine (BENTYL) 10 MG capsule Take 10 mg by mouth 4 (four) times daily -  before meals and at bedtime.      . diphenhydrAMINE (BENADRYL) 25 mg capsule Take 25 mg by mouth at bedtime as needed. Allergic to her cat      . furosemide (LASIX) 20 MG tablet Take 20 mg by mouth daily.      . hydrochlorothiazide 25 MG tablet Take 1 tablet (25 mg total) by mouth daily.  30 tablet  1  . metoprolol tartrate (LOPRESSOR) 25 MG tablet Take 1 tablet (25 mg total) by mouth 2 (two) times daily.  60 tablet  1  . naproxen (NAPROSYN) 500 MG tablet Take 500 mg by mouth 2 (two) times daily with a meal.      . pravastatin (PRAVACHOL) 10 MG tablet Take 10 mg by mouth at bedtime.        . ranitidine (ZANTAC) 150 MG tablet Take 150 mg by mouth 2 (two) times daily.        Marland Kitchen zolpidem (AMBIEN) 10 MG tablet Take 10 mg by mouth at bedtime as needed. sleep        Past Medical History  Diagnosis Date  . Essential hypertension, benign   . Panic attacks   . Cholelithiasis   . Diverticulosis     Colonoscopy 2010 [facility] [place]    Past Surgical History  Procedure Date  . Back  surgery   . Tubal ligation   . Chest cyst removal     YNW:GNFAOZ of systems complete and found to be negative unless listed above  PHYSICAL EXAM BP 118/84  Pulse 62  Resp 18  Ht 5\' 3"  (1.6 m)  Wt 206 lb (93.441 kg)  BMI 36.49 kg/m2  LMP 04/07/2011 \\General : Well developed, well nourished, in no acute distress, talking rapidly. Head: Eyes PERRLA, No xanthomas.   Normal cephalic and atramatic  Lungs: Clear bilaterally to auscultation and percussion. Heart: HRRR S1 S2, without MRG.  Pulses are 2+ & equal.            No carotid bruit. No JVD.  No abdominal bruits.. Positive Murphy;s sign without masses or                  Hernia's noted. Msk:  Back normal, normal gait. Normal strength and tone for age. Extremities: No clubbing, cyanosis or edema.  DP +1 Neuro: Alert and oriented X 3. Psych:  Good affect, responds appropriately, hyperactive.    ASSESSMENT AND PLAN

## 2011-04-18 NOTE — Patient Instructions (Signed)
Your physician recommends that you schedule a follow-up appointment in: As needed  Refer to GI

## 2011-04-18 NOTE — Assessment & Plan Note (Signed)
Cardiac cath is reassuring for normal coronaries. I have discussed this with her.  She states that she is having trouble swallowing and has gallstones. I have referred her to Dr. Kendell Bane and [facility] for evaluation of esophageal spasms and possible EGD. She may need surgical referral as well. Will await GI recommendations. We will see her on PRN basis only.

## 2011-04-21 ENCOUNTER — Encounter (INDEPENDENT_AMBULATORY_CARE_PROVIDER_SITE_OTHER): Payer: Self-pay | Admitting: *Deleted

## 2011-05-10 ENCOUNTER — Ambulatory Visit (INDEPENDENT_AMBULATORY_CARE_PROVIDER_SITE_OTHER): Payer: Self-pay | Admitting: Internal Medicine

## 2011-05-25 ENCOUNTER — Ambulatory Visit (INDEPENDENT_AMBULATORY_CARE_PROVIDER_SITE_OTHER): Payer: Self-pay | Admitting: Internal Medicine

## 2011-09-14 ENCOUNTER — Other Ambulatory Visit (HOSPITAL_COMMUNITY): Payer: Self-pay | Admitting: Nurse Practitioner

## 2011-09-14 DIAGNOSIS — R600 Localized edema: Secondary | ICD-10-CM

## 2011-09-18 ENCOUNTER — Other Ambulatory Visit (HOSPITAL_COMMUNITY): Payer: Self-pay | Admitting: Nurse Practitioner

## 2011-09-18 ENCOUNTER — Ambulatory Visit (HOSPITAL_COMMUNITY)
Admission: RE | Admit: 2011-09-18 | Discharge: 2011-09-18 | Disposition: A | Discharge status: Home / Self Care | Payer: Self-pay | Source: Ambulatory Visit | Attending: Nurse Practitioner | Admitting: Nurse Practitioner

## 2011-09-18 DIAGNOSIS — R05 Cough: Secondary | ICD-10-CM | POA: Insufficient documentation

## 2011-09-18 DIAGNOSIS — M549 Dorsalgia, unspecified: Secondary | ICD-10-CM | POA: Insufficient documentation

## 2011-09-18 DIAGNOSIS — R059 Cough, unspecified: Secondary | ICD-10-CM | POA: Insufficient documentation

## 2011-09-18 DIAGNOSIS — R600 Localized edema: Secondary | ICD-10-CM

## 2011-09-18 DIAGNOSIS — M79609 Pain in unspecified limb: Secondary | ICD-10-CM | POA: Insufficient documentation

## 2011-09-18 DIAGNOSIS — R0602 Shortness of breath: Secondary | ICD-10-CM

## 2011-09-18 DIAGNOSIS — M7989 Other specified soft tissue disorders: Secondary | ICD-10-CM | POA: Insufficient documentation

## 2011-09-18 DIAGNOSIS — R1031 Right lower quadrant pain: Secondary | ICD-10-CM | POA: Insufficient documentation

## 2012-12-10 ENCOUNTER — Emergency Department (HOSPITAL_COMMUNITY)
Admission: EM | Admit: 2012-12-10 | Discharge: 2012-12-10 | Disposition: A | Discharge status: Home / Self Care | Payer: Self-pay | Attending: Emergency Medicine | Admitting: Emergency Medicine

## 2012-12-10 ENCOUNTER — Encounter (HOSPITAL_COMMUNITY): Payer: Self-pay | Admitting: Emergency Medicine

## 2012-12-10 DIAGNOSIS — Z8719 Personal history of other diseases of the digestive system: Secondary | ICD-10-CM | POA: Insufficient documentation

## 2012-12-10 DIAGNOSIS — Z87891 Personal history of nicotine dependence: Secondary | ICD-10-CM | POA: Insufficient documentation

## 2012-12-10 DIAGNOSIS — I10 Essential (primary) hypertension: Secondary | ICD-10-CM | POA: Insufficient documentation

## 2012-12-10 DIAGNOSIS — Z79899 Other long term (current) drug therapy: Secondary | ICD-10-CM | POA: Insufficient documentation

## 2012-12-10 DIAGNOSIS — M5430 Sciatica, unspecified side: Secondary | ICD-10-CM | POA: Insufficient documentation

## 2012-12-10 DIAGNOSIS — Z8659 Personal history of other mental and behavioral disorders: Secondary | ICD-10-CM | POA: Insufficient documentation

## 2012-12-10 DIAGNOSIS — M5432 Sciatica, left side: Secondary | ICD-10-CM

## 2012-12-10 DIAGNOSIS — Z7982 Long term (current) use of aspirin: Secondary | ICD-10-CM | POA: Insufficient documentation

## 2012-12-10 LAB — URINALYSIS, ROUTINE W REFLEX MICROSCOPIC
Bilirubin Urine: NEGATIVE
Glucose, UA: NEGATIVE mg/dL
Ketones, ur: NEGATIVE mg/dL
Leukocytes, UA: NEGATIVE
Nitrite: NEGATIVE
Protein, ur: NEGATIVE mg/dL
Specific Gravity, Urine: 1.02 (ref 1.005–1.030)
Urobilinogen, UA: 0.2 mg/dL (ref 0.0–1.0)
pH: 7 (ref 5.0–8.0)

## 2012-12-10 MED ORDER — IBUPROFEN 800 MG PO TABS
800.0000 mg | ORAL_TABLET | Freq: Once | ORAL | Status: AC
  Administered 2012-12-10: 800 mg via ORAL
  Filled 2012-12-10: qty 1

## 2012-12-10 MED ORDER — IBUPROFEN 800 MG PO TABS: 800.0000 mg | ORAL_TABLET | Freq: Three times a day (TID) | ORAL | Status: DC

## 2012-12-10 MED ORDER — METHYLPREDNISOLONE 4 MG PO KIT: PACK | ORAL | Status: DC

## 2012-12-10 MED ORDER — HYDROCODONE-ACETAMINOPHEN 5-325 MG PO TABS: 1.0000 | ORAL_TABLET | ORAL | Status: DC | PRN

## 2012-12-10 NOTE — ED Notes (Signed)
Post void bladder scan residual = 32 cc

## 2012-12-10 NOTE — ED Notes (Signed)
Patient with no complaints at this time. Respirations even and unlabored. Skin warm/dry. Discharge instructions reviewed with patient at this time. Patient given opportunity to voice concerns/ask questions. I Patient discharged at this time and left Emergency Department with steady gait.   

## 2012-12-10 NOTE — ED Provider Notes (Signed)
This chart was scribed for Tiffany Maw Averill Winters, DO by Caryn Bee, ED Scribe. This patient was seen in room APA15/APA15.   TIME SEEN: 11:08 AM  CHIEF COMPLAINT: Back pain  HPI: Pt is 52 y.o. with history of hypertension, anxiety who presents with sudden onset lower left sided back pain that began 12/08/2012. Pt states the pain radiates through her buttocks and left leg. She has h/o back surgery for herniated disc done in [facility]. When pt had previous back surgery, her surgeon told her that the two discs above it were also in "bad shape". The pain is worsened by standing and lying down. Pt states that currently the pain is 2/10, but standing the pain is 10/10. She reports her pain started after she was doing yard work and lifting children over the weekend.. Pt also complains that her left foot goes to sleep occasionally. She has  urinary urgency and frequency. No bowel or bladder incontinence. No fever. No focal weakness.  ROS: See HPI Constitutional: no fever  Eyes: no drainage  ENT: no runny nose   Cardiovascular:  no chest pain  Resp: no SOB  GI: no vomiting GU: no dysuria, no urinary incontinence, no bowel incontinence, decreased urgency, decreased urinary frequency Integumentary: no rash  Allergy: no hives  Musculoskeletal: no leg swelling, back pain  Neurological: no slurred speech ROS otherwise negative  PAST MEDICAL HISTORY/PAST SURGICAL HISTORY:  Past Medical History  Diagnosis Date  . Essential hypertension, benign   . Panic attacks   . Cholelithiasis   . Diverticulosis     Colonoscopy 2010 - [place]    MEDICATIONS:  Prior to Admission medications   Medication Sig Start Date End Date Taking? Authorizing Provider  aspirin EC 81 MG tablet Take 81 mg by mouth daily.   Yes Historical Provider, MD  diphenhydrAMINE (BENADRYL) 25 mg capsule Take 25 mg by mouth at bedtime. Allergic to her cat   Yes Historical Provider, MD  metoprolol tartrate (LOPRESSOR) 25 MG tablet  Take 25 mg by mouth 2 (two) times daily.   Yes Historical Provider, MD  naproxen (NAPROSYN) 500 MG tablet Take 500 mg by mouth 2 (two) times daily as needed (Pain).    Yes Historical Provider, MD  ranitidine (ZANTAC) 75 MG tablet Take 75 mg by mouth 2 (two) times daily.   Yes Historical Provider, MD  furosemide (LASIX) 20 MG tablet Take 20 mg by mouth daily as needed for fluid.     Historical Provider, MD    ALLERGIES:  Allergies  Allergen Reactions  . Wasp Venom Anaphylaxis  . Percocet [Oxycodone-Acetaminophen] Itching and Nausea And Vomiting    SOCIAL HISTORY:  History  Substance Use Topics  . Smoking status: Former Smoker    Types: Cigarettes    Quit date: 02/21/1980  . Smokeless tobacco: Never Used  . Alcohol Use: Yes     Comment: Occasionaly    FAMILY HISTORY: Family History  Problem Relation Age of Onset  . Coronary artery disease Mother     CAD in her 40's  . Coronary artery disease Father     CAD in his 34's  . Stroke Father     EXAM: Triage Vitals: BP 112/66  Pulse 65  Temp(Src) 98.5 F (36.9 C) (Oral)  Resp 16  Ht 5\' 3"  (1.6 m)  Wt 195 lb (88.451 kg)  BMI 34.55 kg/m2  SpO2 97%  LMP 04/07/2011 CONSTITUTIONAL: Alert and oriented and responds appropriately to questions. Well-appearing; well-nourished HEAD: Normocephalic EYES: Conjunctivae  clear, PERRL ENT: normal nose; no rhinorrhea; moist mucous membranes; pharynx without lesions noted NECK: Supple, no meningismus, no LAD  CARD: RRR; S1 and S2 appreciated; no murmurs, no clicks, no rubs, no gallops RESP: Normal chest excursion without splinting or tachypnea; breath sounds clear and equal bilaterally; no wheezes, no rhonchi, no rales,  ABD/GI: Normal bowel sounds; non-distended; soft, non-tender, no rebound, no guarding BACK:  The back appears normal and is tender to palpation diffusely across the lumbar spine, no midline step-off or deformity, no CVA tenderness EXT: Normal ROM in all joints; non-tender to  palpation; no edema; normal capillary refill; no cyanosis    SKIN: Normal color for age and race; warm NEURO: Moves all extremities equally, normal gait, strength 5/5 in all 4 extremities, sensation to light touch intact diffusely, 2+ reflexes in bilateral upper and lower extremities PSYCH: The patient's mood and manner are appropriate. Grooming and personal hygiene are appropriate.  MEDICAL DECISION MAKING: Patient with back pain with normal neurologic exam. No concern for cauda equina, discitis, osteomyelitis, transverse myelitis, epidural abscess. Post void residual is less than 30. Urine shows no sign of infection. We'll discharge home with prescription for pain medication and a Medrol dose pack as I suspect patient has a herniated disc. Will give orthopedic followup. Given strict return precautions. Patient verbalizes understanding and is comfortable this plan.     Results for orders placed during the hospital encounter of 12/10/12  URINALYSIS, ROUTINE W REFLEX MICROSCOPIC      Result Value Range   Color, Urine YELLOW  YELLOW   APPearance CLEAR  CLEAR   Specific Gravity, Urine 1.020  1.005 - 1.030   pH 7.0  5.0 - 8.0   Glucose, UA NEGATIVE  NEGATIVE mg/dL   Hgb urine dipstick TRACE (*) NEGATIVE   Bilirubin Urine NEGATIVE  NEGATIVE   Ketones, ur NEGATIVE  NEGATIVE mg/dL   Protein, ur NEGATIVE  NEGATIVE mg/dL   Urobilinogen, UA 0.2  0.0 - 1.0 mg/dL   Nitrite NEGATIVE  NEGATIVE   Leukocytes, UA NEGATIVE  NEGATIVE  URINE MICROSCOPIC-ADD ON      Result Value Range   Squamous Epithelial / LPF FEW (*) RARE   RBC / HPF 0-2  <3 RBC/hpf   Bacteria, UA FEW (*) RARE     Tiffany Maw Gleason Ardoin, DO 12/10/12 1537

## 2012-12-10 NOTE — ED Notes (Signed)
L lower back pain since Sunday.  Began with "slight twinge" and has progressed to 10/10 pain w/changing positions or lying w/head elevated.  Pain radiates down buttock to behind knee. Worked doing yard work and lifting children over weekend.   Had "olympic size rupture" of lumbar disc repair in 1999.  Was told at that time 2 discs above it were bad but not in need of repair at that point.  Has not had it evaluated since then.  Does not like to take painkillers, states 800mg  Ibuprofen is enough ususally, but is not working at this point.  Has taken 600mg  PO over weekend.

## 2012-12-11 NOTE — Progress Notes (Signed)
ED/CM noted patient did not have health insurance and/or PCP listed in the computer.  Patient was given the [place] resource handout with information on the clinics, food pantries, and the handout for new health insurance sign-up.  Patient expressed appreciation for this. 

## 2013-02-20 HISTORY — PX: BREAST BIOPSY: SHX20

## 2013-03-10 IMAGING — CR DG CHEST 1V PORT
1 series · 1 of 1 positions shown · non-contrast
Comparison: 10/21/2010

CLINICAL DATA: Chest pain

PORTABLE CHEST - 1 VIEW

[view not recorded]
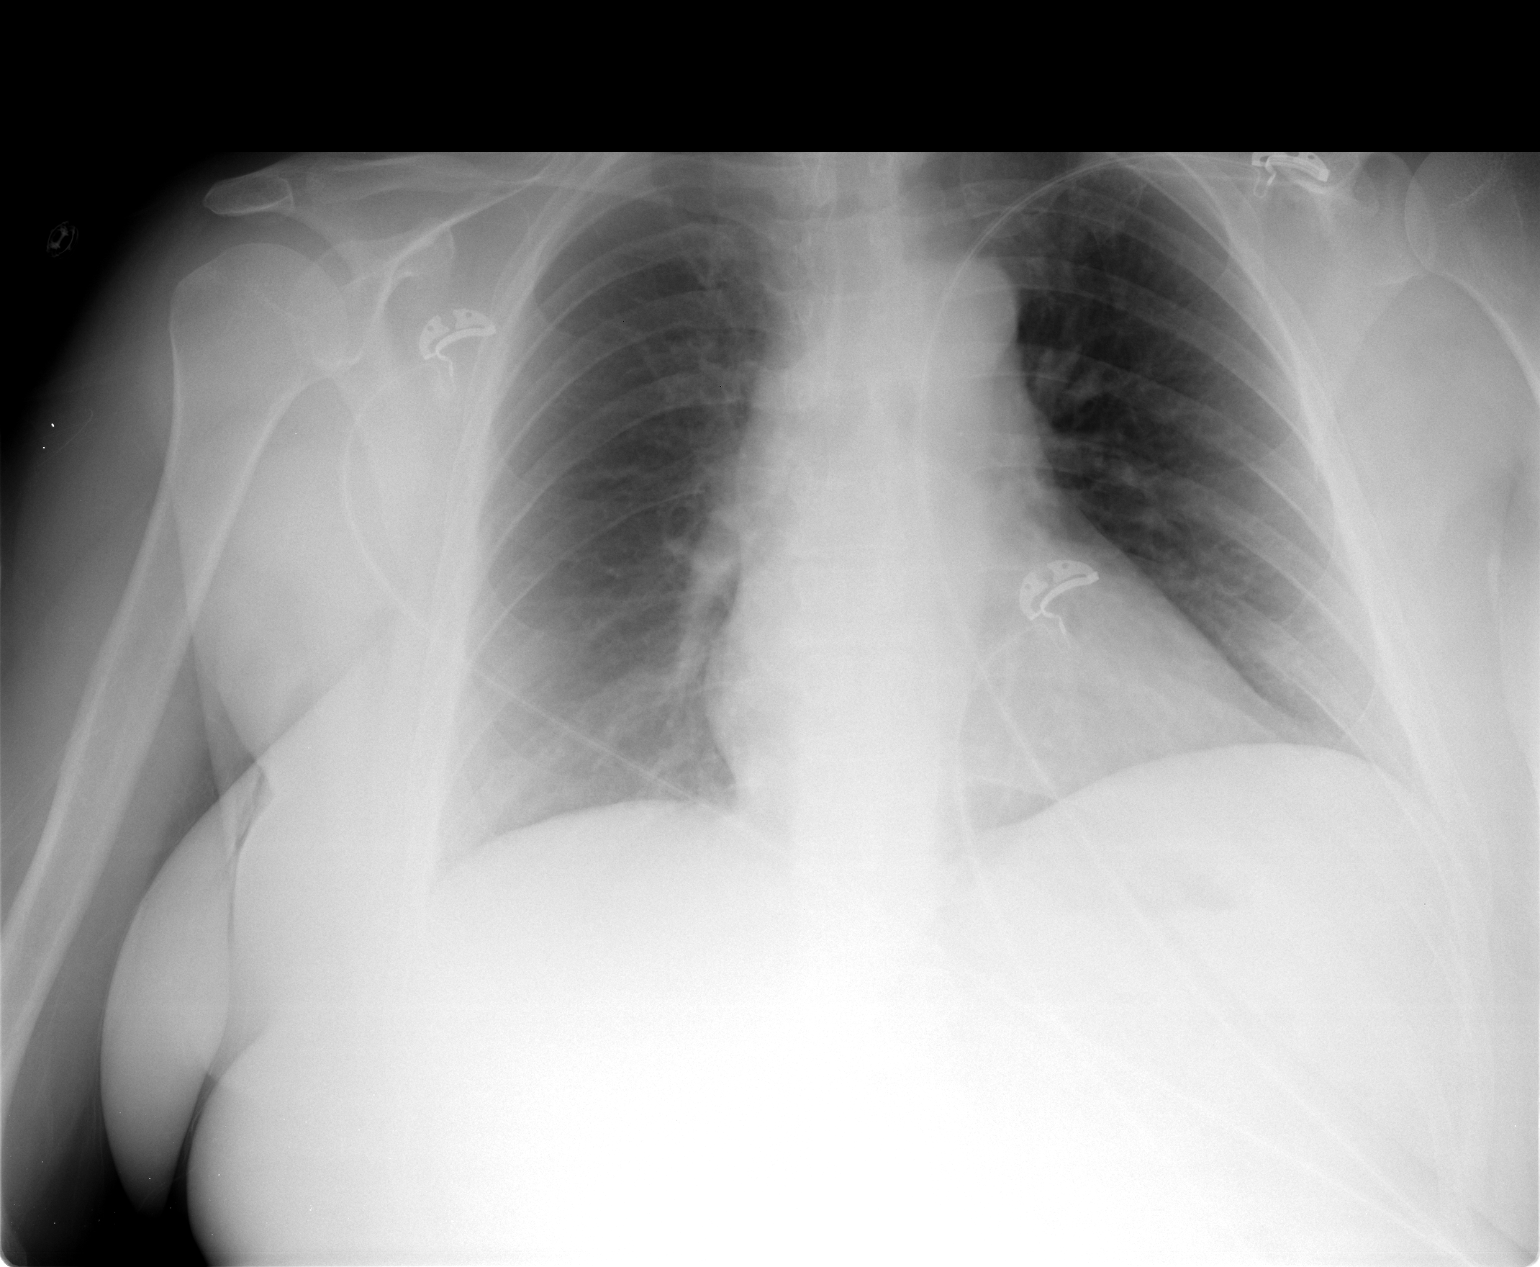

[1 of 1 positions shown; findings below may reference images not displayed]

FINDINGS: Borderline cardiomegaly noted.  No acute infiltrate or
edema.  Stable left basilar atelectasis or scarring.
IMPRESSION: No active disease.  Borderline cardiomegaly.

## 2013-04-22 ENCOUNTER — Other Ambulatory Visit (HOSPITAL_COMMUNITY): Payer: Self-pay | Admitting: *Deleted

## 2013-04-22 DIAGNOSIS — Z1231 Encounter for screening mammogram for malignant neoplasm of breast: Secondary | ICD-10-CM

## 2013-04-28 ENCOUNTER — Ambulatory Visit (HOSPITAL_COMMUNITY): Payer: Self-pay

## 2013-05-05 ENCOUNTER — Ambulatory Visit (HOSPITAL_COMMUNITY): Payer: Self-pay

## 2013-05-20 ENCOUNTER — Ambulatory Visit (HOSPITAL_COMMUNITY)
Admission: RE | Admit: 2013-05-20 | Discharge: 2013-05-20 | Disposition: A | Discharge status: Home / Self Care | Payer: PRIVATE HEALTH INSURANCE | Source: Ambulatory Visit | Attending: *Deleted | Admitting: *Deleted

## 2013-05-20 DIAGNOSIS — Z1231 Encounter for screening mammogram for malignant neoplasm of breast: Secondary | ICD-10-CM | POA: Insufficient documentation

## 2013-05-22 ENCOUNTER — Other Ambulatory Visit: Payer: Self-pay

## 2013-05-22 ENCOUNTER — Other Ambulatory Visit: Payer: Self-pay | Admitting: *Deleted

## 2013-05-22 DIAGNOSIS — R928 Other abnormal and inconclusive findings on diagnostic imaging of breast: Secondary | ICD-10-CM

## 2013-06-04 ENCOUNTER — Other Ambulatory Visit: Payer: Self-pay | Admitting: *Deleted

## 2013-06-04 ENCOUNTER — Ambulatory Visit (HOSPITAL_COMMUNITY)
Admission: RE | Admit: 2013-06-04 | Discharge: 2013-06-04 | Disposition: A | Discharge status: Home / Self Care | Payer: PRIVATE HEALTH INSURANCE | Source: Ambulatory Visit | Attending: *Deleted | Admitting: *Deleted

## 2013-06-04 DIAGNOSIS — N63 Unspecified lump in unspecified breast: Secondary | ICD-10-CM | POA: Insufficient documentation

## 2013-06-04 DIAGNOSIS — R928 Other abnormal and inconclusive findings on diagnostic imaging of breast: Secondary | ICD-10-CM

## 2013-06-11 ENCOUNTER — Other Ambulatory Visit (HOSPITAL_COMMUNITY): Payer: Self-pay

## 2013-06-11 ENCOUNTER — Other Ambulatory Visit (HOSPITAL_COMMUNITY): Payer: Self-pay | Admitting: *Deleted

## 2013-06-11 ENCOUNTER — Ambulatory Visit (HOSPITAL_COMMUNITY)
Admission: RE | Admit: 2013-06-11 | Discharge: 2013-06-11 | Disposition: A | Discharge status: Home / Self Care | Payer: PRIVATE HEALTH INSURANCE | Source: Ambulatory Visit | Attending: *Deleted | Admitting: *Deleted

## 2013-06-11 ENCOUNTER — Encounter (HOSPITAL_COMMUNITY): Payer: Self-pay

## 2013-06-11 DIAGNOSIS — R229 Localized swelling, mass and lump, unspecified: Principal | ICD-10-CM

## 2013-06-11 DIAGNOSIS — N6089 Other benign mammary dysplasias of unspecified breast: Secondary | ICD-10-CM | POA: Insufficient documentation

## 2013-06-11 DIAGNOSIS — D249 Benign neoplasm of unspecified breast: Secondary | ICD-10-CM | POA: Insufficient documentation

## 2013-06-11 DIAGNOSIS — IMO0002 Reserved for concepts with insufficient information to code with codable children: Secondary | ICD-10-CM

## 2013-06-11 DIAGNOSIS — R928 Other abnormal and inconclusive findings on diagnostic imaging of breast: Secondary | ICD-10-CM

## 2013-06-11 MED ORDER — LIDOCAINE HCL (PF) 2 % IJ SOLN
10.0000 mL | Freq: Once | INTRAMUSCULAR | Status: AC
  Administered 2013-06-11: 10 mL

## 2013-06-11 NOTE — Discharge Instructions (Signed)
Breast Biopsy A breast biopsy is a test during which a sample of tissue is taken from your breast. The breast tissue is looked at under a microscope for cancer cells.  BEFORE THE PROCEDURE  Make plans to have someone drive you home after the test.  Do not smoke for 2 weeks before the test. Stop smoking, if you smoke.  Do not drink alcohol for 24 hours before the test.  Wear a good support bra to the test. PROCEDURE  You may be given one of the following:  A medicine to numb the breast area (local anesthesia).  A medicine to make you sleep (general anesthesia). There are different types of breast biopsies. They include:  Fine-needle aspiration.  A needle is put into the breast lump.  The needle takes out fluid and cells from the lump.  Ultrasound imaging may be used to help find the lump and to put the needle it the right spot.  Core-needle biopsy.  A needle is put into the breast lump.  The needle is put in your breast 3 6 times.  The needle removes breast tissue.  An ultrasound image or X-ray is often used to find the right spot to put in the needle.  Stereotactic biopsy.  X-rays and a computer are used to study X-ray pictures of the breast lump.  The computer finds where the needle needs to be put into the breast.  Tissue samples are taken out.  Vacuum-assisted biopsy.  A small cut (incision) is made in your breast.  A biopsy device is put through the cut and into the breast tissue.  The biopsy device draws abnormal breast tissue into the biopsy device.  A large tissue sample is often removed.  No stitches are needed.  Ultrasound-guided core-needle biopsy.  Ultrasound imaging helps guide the needle into the area of the breast that is not normal.  A cut is made in the breast. The needle is put in the needle.  Tissue samples are taken out.  Open biopsy.  A large cut is made in the breast.  Your doctor will try to remove the whole breast lump or as  much as possible. All tissue, fluid, or cell samples are looked at under a microscope.  AFTER THE PROCEDURE  You will be taken to an area to recover. You will be able to go home once you are doing well and are without problems.  You may have bruising on your breast. This is normal.  A pressure bandage (dressing) may be put on your breast for 24 8 hours. This type of bandage is wrapped tightly around your chest. It helps stop fluid from building up underneath tissues. Document Released: 05/01/2011 Document Reviewed: 05/01/2011 [facility] Patient Information 2014 [place].  Breast Biopsy Care After These instructions give you information on caring for yourself after your procedure. Your doctor may also give you more specific instructions. Call your doctor if you have any problems or questions after your procedure. HOME CARE  Only take medicine as told by your doctor.  Do not take aspirin.  Keep your sutures (stitches) dry when bathing.  Protect the biopsy area. Do not let the area get bumped.  Avoid activities that could pull the biopsy site open until your doctor approves. This includes:  Stretching.  Reaching.  Exercise.  Sports.  Lifting more than 3lb.  Continue your normal diet.  Wear a good support bra for as long as told by your doctor.  Change any bandages (dressings) as  told by your doctor.  Do not drink alcohol while taking pain medicine.  Keep all doctor visits as told. Ask when your test results will be ready. Make sure you get your test results. GET HELP RIGHT AWAY IF:   You have a fever.  You have more bleeding (more than a small spot) from the biopsy site.  You have trouble breathing.  You have yellowish-white fluid (pus) coming from the biopsy site.  You have redness, puffiness (swelling), or more pain in the biopsy site.  You have a bad smell coming from the biopsy site.  Your biopsy site opens after sutures, staples, or sticky strips  have been removed.  You have a rash.  You need stronger medicine. MAKE SURE YOU:  Understand these instructions.  Will watch your condition.  Will get help right away if you are not doing well or get worse. Document Released: 12/03/2008 Document Revised: 05/01/2011 Document Reviewed: 03/19/2011 [facility] Patient Information 2014 [place].

## 2013-07-29 ENCOUNTER — Other Ambulatory Visit (HOSPITAL_COMMUNITY)
Admission: RE | Admit: 2013-07-29 | Discharge: 2013-07-29 | Disposition: A | Discharge status: Home / Self Care | Payer: PRIVATE HEALTH INSURANCE | Source: Ambulatory Visit | Attending: Unknown Physician Specialty | Admitting: Unknown Physician Specialty

## 2013-07-29 ENCOUNTER — Other Ambulatory Visit (HOSPITAL_COMMUNITY)
Admission: RE | Admit: 2013-07-29 | Discharge: 2013-07-29 | Disposition: A | Discharge status: Home / Self Care | Payer: PRIVATE HEALTH INSURANCE | Source: Ambulatory Visit | Attending: Nurse Practitioner | Admitting: Nurse Practitioner

## 2013-07-29 DIAGNOSIS — R87610 Atypical squamous cells of undetermined significance on cytologic smear of cervix (ASC-US): Secondary | ICD-10-CM | POA: Insufficient documentation

## 2013-07-29 DIAGNOSIS — N859 Noninflammatory disorder of uterus, unspecified: Secondary | ICD-10-CM | POA: Insufficient documentation

## 2013-08-01 LAB — CYTOLOGY - PAP

## 2013-08-20 ENCOUNTER — Emergency Department (HOSPITAL_COMMUNITY)
Admission: EM | Admit: 2013-08-20 | Discharge: 2013-08-20 | Disposition: A | Discharge status: Home / Self Care | Payer: PRIVATE HEALTH INSURANCE | Attending: Emergency Medicine | Admitting: Emergency Medicine

## 2013-08-20 ENCOUNTER — Emergency Department (HOSPITAL_COMMUNITY): Payer: PRIVATE HEALTH INSURANCE

## 2013-08-20 ENCOUNTER — Encounter (HOSPITAL_COMMUNITY): Payer: Self-pay | Admitting: Emergency Medicine

## 2013-08-20 DIAGNOSIS — I10 Essential (primary) hypertension: Secondary | ICD-10-CM | POA: Insufficient documentation

## 2013-08-20 DIAGNOSIS — K8020 Calculus of gallbladder without cholecystitis without obstruction: Secondary | ICD-10-CM | POA: Insufficient documentation

## 2013-08-20 DIAGNOSIS — R112 Nausea with vomiting, unspecified: Secondary | ICD-10-CM | POA: Insufficient documentation

## 2013-08-20 DIAGNOSIS — Z7982 Long term (current) use of aspirin: Secondary | ICD-10-CM | POA: Insufficient documentation

## 2013-08-20 DIAGNOSIS — Z79899 Other long term (current) drug therapy: Secondary | ICD-10-CM | POA: Insufficient documentation

## 2013-08-20 DIAGNOSIS — R1011 Right upper quadrant pain: Secondary | ICD-10-CM

## 2013-08-20 DIAGNOSIS — Z87891 Personal history of nicotine dependence: Secondary | ICD-10-CM | POA: Insufficient documentation

## 2013-08-20 DIAGNOSIS — Z8719 Personal history of other diseases of the digestive system: Secondary | ICD-10-CM | POA: Insufficient documentation

## 2013-08-20 DIAGNOSIS — Z791 Long term (current) use of non-steroidal anti-inflammatories (NSAID): Secondary | ICD-10-CM | POA: Insufficient documentation

## 2013-08-20 DIAGNOSIS — R197 Diarrhea, unspecified: Secondary | ICD-10-CM | POA: Insufficient documentation

## 2013-08-20 LAB — CBC WITH DIFFERENTIAL/PLATELET
BASOS ABS: 0.1 10*3/uL (ref 0.0–0.1)
BASOS PCT: 0 % (ref 0–1)
Eosinophils Absolute: 0.2 10*3/uL (ref 0.0–0.7)
Eosinophils Relative: 2 % (ref 0–5)
HCT: 44.3 % (ref 36.0–46.0)
HEMOGLOBIN: 15 g/dL (ref 12.0–15.0)
Lymphocytes Relative: 22 % (ref 12–46)
Lymphs Abs: 2.8 10*3/uL (ref 0.7–4.0)
MCH: 32.3 pg (ref 26.0–34.0)
MCHC: 33.9 g/dL (ref 30.0–36.0)
MCV: 95.5 fL (ref 78.0–100.0)
MONOS PCT: 10 % (ref 3–12)
Monocytes Absolute: 1.3 10*3/uL — ABNORMAL HIGH (ref 0.1–1.0)
NEUTROS ABS: 8.1 10*3/uL — AB (ref 1.7–7.7)
Neutrophils Relative %: 65 % (ref 43–77)
Platelets: 302 10*3/uL (ref 150–400)
RBC: 4.64 MIL/uL (ref 3.87–5.11)
RDW: 13.4 % (ref 11.5–15.5)
WBC: 12.5 10*3/uL — ABNORMAL HIGH (ref 4.0–10.5)

## 2013-08-20 LAB — URINE MICROSCOPIC-ADD ON

## 2013-08-20 LAB — COMPREHENSIVE METABOLIC PANEL
ALBUMIN: 4.1 g/dL (ref 3.5–5.2)
ALK PHOS: 78 U/L (ref 39–117)
ALT: 26 U/L (ref 0–35)
AST: 30 U/L (ref 0–37)
Anion gap: 17 — ABNORMAL HIGH (ref 5–15)
BUN: 12 mg/dL (ref 6–23)
CO2: 23 mEq/L (ref 19–32)
Calcium: 9.9 mg/dL (ref 8.4–10.5)
Chloride: 99 mEq/L (ref 96–112)
Creatinine, Ser: 0.89 mg/dL (ref 0.50–1.10)
GFR calc Af Amer: 86 mL/min — ABNORMAL LOW (ref >90)
GFR calc non Af Amer: 74 mL/min — ABNORMAL LOW (ref >90)
Glucose, Bld: 115 mg/dL — ABNORMAL HIGH (ref 70–99)
POTASSIUM: 4.3 meq/L (ref 3.7–5.3)
Sodium: 139 mEq/L (ref 137–147)
TOTAL PROTEIN: 8.2 g/dL (ref 6.0–8.3)
Total Bilirubin: 0.2 mg/dL — ABNORMAL LOW (ref 0.3–1.2)

## 2013-08-20 LAB — URINALYSIS, ROUTINE W REFLEX MICROSCOPIC
Bilirubin Urine: NEGATIVE
GLUCOSE, UA: NEGATIVE mg/dL
Ketones, ur: NEGATIVE mg/dL
Nitrite: NEGATIVE
Protein, ur: NEGATIVE mg/dL
Urobilinogen, UA: 0.2 mg/dL (ref 0.0–1.0)
pH: 6 (ref 5.0–8.0)

## 2013-08-20 LAB — LIPASE, BLOOD: Lipase: 25 U/L (ref 11–59)

## 2013-08-20 MED ORDER — CIPROFLOXACIN HCL 500 MG PO TABS: 500.0000 mg | ORAL_TABLET | Freq: Two times a day (BID) | ORAL | Status: DC

## 2013-08-20 NOTE — ED Provider Notes (Signed)
CSN: [id number]     Arrival date & time 08/20/13  0910 History  This chart was scribed for Tiffany Norrie, MD by Tiffany Barker ED Scribe. This patient was seen in room APA14/APA14 and the patient's care was started at 9:55.  Chief Complaint  Patient presents with  . Abdominal Pain   The history is provided by the patient. No language interpreter was used.   HPI Comments: Tiffany Barker is a 54 y.o. female with past medical history of cholethiasis (January 2012) who presents to the Emergency Department complaining of persistent, gradually improving abdominal pain that started 4 days ago. Patient states she was at the [place] with her boyfriend and she felt a sudden onset of severe epigastric abdominal pain described as a "gas bubble" after eating steak and potatoes. Pt states she tried to stretch and ambulate to relieve her pain but found no relief. Pt was drinking ("3-4 beers") a few hours before this episode began. Pt states she had severe pain that radiates to her thoracic back and she then vomited for almost 2 hours before visiting [facility] ED. Pt states that she had a normal CXR and EKG at [place]. She states that she was found to have an elevated WBC count, suspicious for a gallbladder infection. Pt reports having pain at 10/10 but has now improved to 2/10. She was told to be rechecked for possible infected gallbladder. Pt states she was nauseated yesterday, but not currently experiencing any nausea. Pt denies fever, dizziness and light-headedness. She states palpation and certain foods make the pain worse. Nothing makes it feel better. Pt states she had associated runny and loose diarrhea, that looked "greasy" with 10 episodes yesterday. Pt states she is aware of needing cholecystectomy but has no insurance and is states she is terrified of doctors and needles. Pt called her PCP and can't be seen until July 31st. Her current pain is "less than a 2/10" and was a 10/10 4 days ago. She states the  pain she had initially 4 days ago was the worst the pain has ever been.  Patient reports she used to take Xanax however she stopped taking Xanax in March.   Pt currently takes Lasix, Metoprolol and Ranitidine and is compliant. Pt also reports she takes baby aspirin daily. Pt has a part-time job working at [employer].  PCP: Dr. Rivka Barker at [facility]   Past Medical History  Diagnosis Date  . Essential hypertension, benign   . Panic attacks   . Cholelithiasis   . Diverticulosis     Colonoscopy 2010 [facility]   Past Surgical History  Procedure Laterality Date  . Back surgery    . Tubal ligation    . Chest cyst removal     Family History  Problem Relation Age of Onset  . Coronary artery disease Mother     CAD in her 55's  . Coronary artery disease Father     CAD in his 34's  . Stroke Father    History  Substance Use Topics  . Smoking status: Former Smoker -- 0.50 packs/day for 2 years    Types: Cigarettes    Quit date: 02/21/1980  . Smokeless tobacco: Never Used  . Alcohol Use: Yes     Comment: Occasionaly  works part-time at [employer] in [place] Term Preterm Abortions TAB SAB Ect Mult Living   1 1 1  1     Review of Systems  Constitutional: Negative for fever.  Gastrointestinal: Positive for nausea (waxing and waning), vomiting, abdominal pain and diarrhea (10 episodes yesterday, loose and runny).  Neurological: Negative for dizziness and light-headedness.  All other systems reviewed and are negative.   Allergies  Wasp venom and Percocet  Home Medications   Prior to Admission medications   Medication Sig Start Date End Date Taking? Authorizing Provider  aspirin EC 81 MG tablet Take 81 mg by mouth daily.   Yes Historical Provider, MD  diphenhydrAMINE (BENADRYL) 25 mg capsule Take 25 mg by mouth at bedtime. Allergic to her cat   Yes Historical Provider, MD  furosemide (LASIX) 20 MG tablet Take 20  mg by mouth daily.    Yes Historical Provider, MD  metoprolol tartrate (LOPRESSOR) 25 MG tablet Take 25 mg by mouth 2 (two) times daily.   Yes Historical Provider, MD  naproxen (NAPROSYN) 500 MG tablet Take 500 mg by mouth 2 (two) times daily as needed (Pain).    Yes Historical Provider, MD  Naproxen Sod-Diphenhydramine (ALEVE PM) 220-25 MG TABS Take 1 tablet by mouth at bedtime as needed (insomnia).   Yes Historical Provider, MD  ranitidine (ZANTAC) 75 MG tablet Take 75 mg by mouth 2 (two) times daily.   Yes Historical Provider, MD  ciprofloxacin (CIPRO) 500 MG tablet Take 1 tablet (500 mg total) by mouth 2 (two) times daily. 08/20/13   Tiffany Norrie, MD   Triage Vitals: BP 134/95  Pulse 63  Temp(Src) 98.1 F (36.7 C) (Oral)  Resp 16  Ht 5\' 4"  (1.626 m)  Wt 212 lb (96.163 kg)  BMI 36.37 kg/m2  SpO2 97%  LMP 07/10/2013  Vital signs normal    Physical Exam  Nursing note and vitals reviewed. Constitutional: She is oriented to person, place, and time. She appears well-developed and well-nourished.  Non-toxic appearance. She does not appear ill. No distress.  Smiling in NAD  HENT:  Head: Normocephalic and atraumatic.  Right Ear: External ear normal.  Left Ear: External ear normal.  Nose: Nose normal. No mucosal edema or rhinorrhea.  Mouth/Throat: Oropharynx is clear and moist and mucous membranes are normal. No dental abscesses or uvula swelling.  Eyes: Conjunctivae and EOM are normal. Pupils are equal, round, and reactive to light.  Neck: Normal range of motion and full passive range of motion without pain. Neck supple.  Cardiovascular: Normal rate, regular rhythm and normal heart sounds.  Exam reveals no gallop and no friction rub.   No murmur heard. Pulmonary/Chest: Effort normal and breath sounds normal. No respiratory distress. She has no wheezes. She has no rhonchi. She has no rales. She exhibits no tenderness and no crepitus.  Abdominal: Soft. Normal appearance and bowel sounds are  normal. She exhibits no distension. Tiffany is tenderness in the right upper quadrant and epigastric area. Tiffany is no rebound and no guarding.    Very tender in RUQ and epigastric area.  Musculoskeletal: Normal range of motion. She exhibits no edema and no tenderness.  Moves all extremities well.   Neurological: She is alert and oriented to person, place, and time. She has normal strength. No cranial nerve deficit.  Skin: Skin is warm, dry and intact. No rash noted. No erythema. No pallor.  Psychiatric: She has a normal mood and affect. Her speech is normal and behavior is normal. Her mood appears not anxious.    ED Course  Procedures (including critical care time)  DIAGNOSTIC STUDIES: Oxygen Saturation is 97% on RA, normal by my interpretation.    COORDINATION OF CARE: 10:00 AM- Pt offered pain medication but Pt declines. Pt advised of plan for treatment and pt agrees.  12:31 PM- Discussed pt's lab and radiology findings with pt. Pt advised of plan for treatment and pt agrees. PT states she is going to look into [employer] to get insurance.  12:35 Dr Arnoldo Morale, start on cipro, can see in the office tomorrow with co-pay  Results for orders placed during the hospital encounter of 08/20/13  CBC WITH DIFFERENTIAL      Result Value Ref Range   WBC 12.5 (*) 4.0 - 10.5 K/uL   RBC 4.64  3.87 - 5.11 MIL/uL   Hemoglobin 15.0  12.0 - 15.0 g/dL   HCT 44.3  36.0 - 46.0 %   MCV 95.5  78.0 - 100.0 fL   MCH 32.3  26.0 - 34.0 pg   MCHC 33.9  30.0 - 36.0 g/dL   RDW 13.4  11.5 - 15.5 %   Platelets 302  150 - 400 K/uL   Neutrophils Relative % 65  43 - 77 %   Neutro Abs 8.1 (*) 1.7 - 7.7 K/uL   Lymphocytes Relative 22  12 - 46 %   Lymphs Abs 2.8  0.7 - 4.0 K/uL   Monocytes Relative 10  3 - 12 %   Monocytes Absolute 1.3 (*) 0.1 - 1.0 K/uL   Eosinophils Relative 2  0 - 5 %   Eosinophils Absolute 0.2  0.0 - 0.7 K/uL   Basophils Relative 0  0 - 1 %   Basophils Absolute 0.1  0.0 - 0.1 K/uL   WBC  Morphology ATYPICAL LYMPHOCYTES    COMPREHENSIVE METABOLIC PANEL      Result Value Ref Range   Sodium 139  137 - 147 mEq/L   Potassium 4.3  3.7 - 5.3 mEq/L   Chloride 99  96 - 112 mEq/L   CO2 23  19 - 32 mEq/L   Glucose, Bld 115 (*) 70 - 99 mg/dL   BUN 12  6 - 23 mg/dL   Creatinine, Ser 0.89  0.50 - 1.10 mg/dL   Calcium 9.9  8.4 - 10.5 mg/dL   Total Protein 8.2  6.0 - 8.3 g/dL   Albumin 4.1  3.5 - 5.2 g/dL   AST 30  0 - 37 U/L   ALT 26  0 - 35 U/L   Alkaline Phosphatase 78  39 - 117 U/L   Total Bilirubin 0.2 (*) 0.3 - 1.2 mg/dL   GFR calc non Af Amer 74 (*) >90 mL/min   GFR calc Af Amer 86 (*) >90 mL/min   Anion gap 17 (*) 5 - 15  LIPASE, BLOOD      Result Value Ref Range   Lipase 25  11 - 59 U/L  URINALYSIS, ROUTINE W REFLEX MICROSCOPIC      Result Value Ref Range   Color, Urine YELLOW  YELLOW   APPearance CLEAR  CLEAR   Specific Gravity, Urine <1.005 (*) 1.005 - 1.030   pH 6.0  5.0 - 8.0   Glucose, UA NEGATIVE  NEGATIVE mg/dL   Hgb urine dipstick TRACE (*) NEGATIVE   Bilirubin Urine NEGATIVE  NEGATIVE   Ketones, ur NEGATIVE  NEGATIVE mg/dL   Protein, ur NEGATIVE  NEGATIVE mg/dL   Urobilinogen, UA 0.2  0.0 - 1.0 mg/dL   Nitrite NEGATIVE  NEGATIVE   Leukocytes, UA SMALL (*)  NEGATIVE  URINE MICROSCOPIC-ADD ON      Result Value Ref Range   WBC, UA 0-2  <3 WBC/hpf   RBC / HPF 0-2  <3 RBC/hpf   Laboratory interpretation all normal except for leukocytosis   US Abdomen Limited Ruq  08/20/2013   CLINICAL DATA:  Right upper quadrant abdominal pain.  EXAM: US ABDOMEN LIMITED - RIGHT UPPER QUADRANT  COMPARISON:  Abdominal ultrasound 12/15/2010.  FINDINGS: Gallbladder:  Multiple echogenic foci within the gallbladder with posterior acoustic shadowing, compatible with gallstones, largest of which measures approximately 1.7 cm. Gallbladder does not appear distended. Gallbladder wall thickness is normal at 1 mm. No pericholecystic fluid. Per report from the sonographer, the patient did  not exhibit a sonographic Murphy's sign on examination.  Common bile duct:  Diameter: Normal in caliber measuring 6 mm in the porta hepatis.  Liver:  No focal lesion identified. Mild diffuse increased echogenicity suggesting hepatic steatosis. No intrahepatic biliary ductal dilatation.  IMPRESSION: 1. Cholelithiasis without evidence to suggest acute cholecystitis at this time. 2. Increased echogenicity throughout the liver suggestive of hepatic steatosis.   Electronically Signed   By: Vinnie Langton M.D.   On: 08/20/2013 10:35     EKG Interpretation None      MDM   Final diagnoses:  Right upper quadrant pain  Gallstones    Discharge Medication List as of 08/20/2013 12:40 PM    START taking these medications   Details  ciprofloxacin (CIPRO) 500 MG tablet Take 1 tablet (500 mg total) by mouth 2 (two) times daily., Starting 08/20/2013, Until Discontinued, Print        Plan discharge  Rolland Porter, MD, FACEP   I personally performed the services described in this documentation, which was scribed in my presence. The recorded information has been reviewed and considered.  Rolland Porter, MD, Abram Sander    Tiffany Norrie, MD 08/20/13 [phone]

## 2013-08-20 NOTE — ED Notes (Signed)
Patient c/o epigastric pain with nausea and vomiting that started on Saturday. Patient seen [facility] for pain, had EKG blood work, and chest x-ray but was unable to have CT or ultrasound due to lack of staff. EKG and chest x-ray was normal per patient but WBC was elevated. Patient was to follow-up with PCP in which she called but can't get a appointment until end of July and was told to come to ER if pain no better this morning. Patient does report diarrhea yesterday, denies any fevers.

## 2013-08-20 NOTE — Discharge Instructions (Signed)
Avoid fried, spicy or greasy foods. Take the antibiotics until gone. Dr Arnoldo Morale can see you in the office tomorrow if you want.  Return to the ED if you get fever, have uncontrolled vomiting or pain.  Biliary Colic  Biliary colic is a steady or irregular pain in the upper abdomen. It is usually under the right side of the rib cage. It happens when gallstones interfere with the normal flow of bile from the gallbladder. Bile is a liquid that helps to digest fats. Bile is made in the liver and stored in the gallbladder. When you eat a meal, bile passes from the gallbladder through the cystic duct and the common bile duct into the small intestine. There, it mixes with partially digested food. If a gallstone blocks either of these ducts, the normal flow of bile is blocked. The muscle cells in the bile duct contract forcefully to try to move the stone. This causes the pain of biliary colic.  SYMPTOMS   A person with biliary colic usually complains of pain in the upper abdomen. This pain can be:  In the center of the upper abdomen just below the breastbone.  In the upper-right part of the abdomen, near the gallbladder and liver.  Spread back toward the right shoulder blade.  Nausea and vomiting.  The pain usually occurs after eating.  Biliary colic is usually triggered by the digestive system's demand for bile. The demand for bile is high after fatty meals. Symptoms can also occur when a person who has been fasting suddenly eats a very large meal. Most episodes of biliary colic pass after 1 to 5 hours. After the most intense pain passes, your abdomen may continue to ache mildly for about 24 hours. DIAGNOSIS  After you describe your symptoms, your caregiver will perform a physical exam. He or she will pay attention to the upper right portion of your belly (abdomen). This is the area of your liver and gallbladder. An ultrasound will help your caregiver look for gallstones. Specialized scans of the  gallbladder may also be done. Blood tests may be done, especially if you have fever or if your pain persists. PREVENTION  Biliary colic can be prevented by controlling the risk factors for gallstones. Some of these risk factors, such as heredity, increasing age, and pregnancy are a normal part of life. Obesity and a high-fat diet are risk factors you can change through a healthy lifestyle. Women going through menopause who take hormone replacement therapy (estrogen) are also more likely to develop biliary colic. TREATMENT   Pain medication may be prescribed.  You may be encouraged to eat a fat-free diet.  If the first episode of biliary colic is severe, or episodes of colic keep retuning, surgery to remove the gallbladder (cholecystectomy) is usually recommended. This procedure can be done through small incisions using an instrument called a laparoscope. The procedure often requires a brief stay in the hospital. Some people can leave the hospital the same day. It is the most widely used treatment in people troubled by painful gallstones. It is effective and safe, with no complications in more than 90% of cases.  If surgery cannot be done, medication that dissolves gallstones may be used. This medication is expensive and can take months or years to work. Only small stones will dissolve.  Rarely, medication to dissolve gallstones is combined with a procedure called shock-wave lithotripsy. This procedure uses carefully aimed shock waves to break up gallstones. In many people treated with this procedure, gallstones  form again within a few years. PROGNOSIS  If gallstones block your cystic duct or common bile duct, you are at risk for repeated episodes of biliary colic. There is also a 25% chance that you will develop a gallbladder infection(acute cholecystitis), or some other complication of gallstones within 10 to 20 years. If you have surgery, schedule it at a time that is convenient for you and at a  time when you are not sick. HOME CARE INSTRUCTIONS   Drink plenty of clear fluids.  Avoid fatty, greasy or fried foods, or any foods that make your pain worse.  Take medications as directed. SEEK MEDICAL CARE IF:   You develop a fever over 100.5 F (38.1 C).  Your pain gets worse over time.  You develop nausea that prevents you from eating and drinking.  You develop vomiting. SEEK IMMEDIATE MEDICAL CARE IF:   You have continuous or severe belly (abdominal) pain which is not relieved with medications.  You develop nausea and vomiting which is not relieved with medications.  You have symptoms of biliary colic and you suddenly develop a fever and shaking chills. This may signal cholecystitis. Call your caregiver immediately.  You develop a yellow color to your skin or the white part of your eyes (jaundice). Document Released: 07/10/2005 Document Revised: 05/01/2011 Document Reviewed: 09/19/2007 [facility] Patient Information 2015 [place], [place]. This information is not intended to replace advice given to you by your health care provider. Make sure you discuss any questions you have with your health care provider.  Cholelithiasis Cholelithiasis (also called gallstones) is a form of gallbladder disease in which gallstones form in your gallbladder. The gallbladder is an organ that stores bile made in the liver, which helps digest fats. Gallstones begin as small crystals and slowly grow into stones. Gallstone pain occurs when the gallbladder spasms and a gallstone is blocking the duct. Pain can also occur when a stone passes out of the duct.  RISK FACTORS  Being female.   Having multiple pregnancies. Health care providers sometimes advise removing diseased gallbladders before future pregnancies.   Being obese.  Eating a diet heavy in fried foods and fat.   Being older than 26 years and increasing age.   Prolonged use of medicines containing female hormones.   Having  diabetes mellitus.   Rapidly losing weight.   Having a family history of gallstones (heredity).  SYMPTOMS  Nausea.   Vomiting.  Abdominal pain.   Yellowing of the skin (jaundice).   Sudden pain. It may persist from several minutes to several hours.  Fever.   Tenderness to the touch. In some cases, when gallstones do not move into the bile duct, people have no pain or symptoms. These are called "silent" gallstones.  TREATMENT Silent gallstones do not need treatment. In severe cases, emergency surgery may be required. Options for treatment include:  Surgery to remove the gallbladder. This is the most common treatment.  Medicines. These do not always work and may take 6-12 months or more to work.  Shock wave treatment (extracorporeal biliary lithotripsy). In this treatment an ultrasound machine sends shock waves to the gallbladder to break gallstones into smaller pieces that can pass into the intestines or be dissolved by medicine. HOME CARE INSTRUCTIONS   Only take over-the-counter or prescription medicines for pain, discomfort, or fever as directed by your health care provider.   Follow a low-fat diet until seen again by your health care provider. Fat causes the gallbladder to contract, which can result in  pain.   Follow up with your health care provider as directed. Attacks are almost always recurrent and surgery is usually required for permanent treatment.  SEEK IMMEDIATE MEDICAL CARE IF:   Your pain increases and is not controlled by medicines.   You have a fever or persistent symptoms for more than 2-3 days.   You have a fever and your symptoms suddenly get worse.   You have persistent nausea and vomiting.  MAKE SURE YOU:   Understand these instructions.  Will watch your condition.  Will get help right away if you are not doing well or get worse. Document Released: 02/02/2005 Document Revised: 10/09/2012 Document Reviewed: 07/31/2012 [facility]  Patient Information 2015 [place], [place]. This information is not intended to replace advice given to you by your health care provider. Make sure you discuss any questions you have with your health care provider.  Low-Fat Diet for Pancreatitis or Gallbladder Conditions A low-fat diet can be helpful if you have pancreatitis or a gallbladder condition. With these conditions, your pancreas and gallbladder have trouble digesting fats. A healthy eating plan with less fat will help rest your pancreas and gallbladder and reduce your symptoms. WHAT DO I NEED TO KNOW ABOUT THIS DIET?  Eat a low-fat diet.  Reduce your fat intake to less than 20-30% of your total daily calories. This is less than 50-60 g of fat per day.  Remember that you need some fat in your diet. Ask your dietician what your daily goal should be.  Choose nonfat and low-fat healthy foods. Look for the words "nonfat," "low fat," or "fat free."  As a guide, look on the label and choose foods with less than 3 g of fat per serving. Eat only one serving.  Avoid alcohol.  Do not smoke. If you need help quitting, talk with your health care provider.  Eat small frequent meals instead of three large heavy meals. WHAT FOODS CAN I EAT? Grains Include healthy grains and starches such as potatoes, wheat bread, fiber-rich cereal, and brown rice. Choose whole grain options whenever possible. In adults, whole grains should account for 45-65% of your daily calories.  Fruits and Vegetables Eat plenty of fruits and vegetables. Fresh fruits and vegetables add fiber to your diet. Meats and Other Protein Sources Eat lean meat such as chicken and pork. Trim any fat off of meat before cooking it. Eggs, fish, and beans are other sources of protein. In adults, these foods should account for 10-35% of your daily calories. Dairy Choose low-fat milk and dairy options. Dairy includes fat and protein, as well as calcium.  Fats and Oils Limit high-fat foods  such as fried foods, sweets, baked goods, sugary drinks.  Other Creamy sauces and condiments, such as mayonnaise, can add extra fat. Think about whether or not you need to use them, or use smaller amounts or low fat options. WHAT FOODS ARE NOT RECOMMENDED?  High fat foods, such as:  [employer].  Ice cream.  [place] toast.  Sweet rolls.  Pizza.  Cheese bread.  Foods covered with batter, butter, creamy sauces, or cheese.  Fried foods.  Sugary drinks and desserts.  Foods that cause gas or bloating Document Released: 02/11/2013 Document Reviewed: 01/20/2013 [facility] Patient Information 2015 [place], [place]. This information is not intended to replace advice given to you by your health care provider. Make sure you discuss any questions you have with your health care provider.

## 2013-10-23 ENCOUNTER — Encounter (HOSPITAL_COMMUNITY): Payer: Self-pay | Admitting: Pharmacy Technician

## 2013-10-23 NOTE — H&P (Signed)
  NTS SOAP Note  Vital Signs:  Vitals as of: 03/29/2534: Systolic 644: Diastolic 85: Heart Rate 59: Temp 96.49F: Height 76ft 4in: Weight 215Lbs 0 Ounces: BMI 36.9  BMI : 36.9 kg/m2  Subjective: This 53 year old female presents for of abdominal pain and nausea.  Has been present for several months now.  Found on u/s of gallbladder to have cholelithiasis.  Normal common bile duct.  No fever, chills,  jaundice.  Pain is in right upper quadrant,  radiates to the right flank,  made worse with fatty food.    Review of Symptoms:  Gastrointestinabdominal pain, nausea   Past Medical History:  Reviewed  Past Medical History  Surgical History: back surgery, BTL, cyst removal from chest wall Medical Problems: HTN, reflux Allergies: nkda Medications: metoprolol, ranitidine, baby asa, lasix   Social History:Reviewed  Social History  Preferred Language: English Race:  White Ethnicity: Not Hispanic / Latino Age: 53 Years 7 Months Marital Status:  D Alcohol: socially   Smoking Status: Never smoker reviewed on 10/23/2013 Functional Status reviewed on 10/23/2013 ------------------------------------------------ Bathing: Normal Cooking: Normal Dressing: Normal Driving: Normal Eating: Normal Managing Meds: Normal Oral Care: Normal Shopping: Normal Toileting: Normal Transferring: Normal Walking: Normal Cognitive Status reviewed on 10/23/2013 ------------------------------------------------ Attention: Normal Decision Making: Normal Language: Normal Memory: Normal Motor: Normal Perception: Normal Problem Solving: Normal Visual and Spatial: Normal   Family History:Reviewed  Family Health History Mother, Deceased; Diverticulitis of colon;  Father, Living; Healthy;     Objective Information: General:Well appearing, well nourished in no distress. no scleral icterus Heart:RRR, no murmur or gallop.  Normal S1, S2.  No S3, S4.  Lungs:  CTA bilaterally, no wheezes,  rhonchi, rales.  Breathing unlabored. Abdomen:Soft, tender in right upper quadrant to deep palpation,  ND, normal bowel sounds, no HSM, no masses.  No peritoneal signs.  Assessment:Biliary colic,  cholelithiasis  Diagnoses: 574.20  K80.20 Gallstone (Calculus of gallbladder without cholecystitis without obstruction)  Procedures: 03474 - OFFICE OUTPATIENT VISIT 15 MINUTES    Plan:  Scheduled for laparoscopic cholecystectomy on 10/31/13.   Patient Education:Alternative treatments to surgery were discussed with patient (and family).  Risks and benefits  of procedure including bleeding,  infection,  hepatobiliary injury,  and the possibility of an open procedure  were fully explained to the patient (and family) who gave informed consent. Patient/family questions were addressed.  Follow-up:Pending Surgery

## 2013-10-24 ENCOUNTER — Other Ambulatory Visit (HOSPITAL_COMMUNITY): Payer: PRIVATE HEALTH INSURANCE

## 2013-10-24 NOTE — Patient Instructions (Addendum)
Tiffany Barker  10/24/2013   Your procedure is scheduled on:  Friday, 10/31/13  Report to [facility] at [facility].  Call this number if you have problems the morning of surgery: [phone]   Remember:   Do not eat food or drink liquids after midnight.   Take these medicines the morning of surgery with A SIP OF WATER: metoprolol and zantac   Do not wear jewelry, make-up or nail polish.  Do not wear lotions, powders, or perfumes. You may wear deodorant.  Do not shave 48 hours prior to surgery. Men may shave face and neck.  Do not bring valuables to the hospital.  [facility] is not responsible                  for any belongings or valuables.               Contacts, dentures or bridgework may not be worn into surgery.  Leave suitcase in the car. After surgery it may be brought to your room.  For patients admitted to the hospital, discharge time is determined by your                treatment team.               Patients discharged the day of surgery will not be allowed to drive  home.  Name and phone number of your driver: family  Special Instructions: Shower using CHG 2 nights before surgery and the night before surgery.  If you shower the day of surgery use CHG.  Use special wash - you have one bottle of CHG for all showers.  You should use approximately 1/3 of the bottle for each shower.   Please read over the following fact sheets that you were given: Pain Booklet, Anesthesia Post-op Instructions and Care and Recovery After Surgery   General Anesthesia, Care After Refer to this sheet in the next few weeks. These instructions provide you with information on caring for yourself after your procedure. Your health care provider may also give you more specific instructions. Your treatment has been planned according to current medical practices, but problems sometimes occur. Call your health care provider if you have any problems or questions after your procedure. WHAT TO EXPECT AFTER THE  PROCEDURE After the procedure, it is typical to experience:  Sleepiness.  Nausea and vomiting. HOME CARE INSTRUCTIONS  For the first 24 hours after general anesthesia:  Have a responsible person with you.  Do not drive a car. If you are alone, do not take public transportation.  Do not drink alcohol.  Do not take medicine that has not been prescribed by your health care provider.  Do not sign important papers or make important decisions.  You may resume a normal diet and activities as directed by your health care provider.  Change bandages (dressings) as directed.  If you have questions or problems that seem related to general anesthesia, call the hospital and ask for the anesthetist or anesthesiologist on call. SEEK MEDICAL CARE IF:  You have nausea and vomiting that continue the day after anesthesia.  You develop a rash. SEEK IMMEDIATE MEDICAL CARE IF:   You have difficulty breathing.  You have chest pain.  You have any allergic problems. Document Released: 05/15/2000 Document Revised: 02/11/2013 Document Reviewed: 08/22/2012 [facility] Patient Information 2015 [place], [place]. This information is not intended to replace advice given to you by your health care provider. Make sure you discuss any questions  you have with your health care provider. Laparoscopic Cholecystectomy, Care After Refer to this sheet in the next few weeks. These instructions provide you with information on caring for yourself after your procedure. Your health care provider may also give you more specific instructions. Your treatment has been planned according to current medical practices, but problems sometimes occur. Call your health care provider if you have any problems or questions after your procedure. WHAT TO EXPECT AFTER THE PROCEDURE After your procedure, it is typical to have the following:  Pain at your incision sites. You will be given pain medicines to control the pain.  Mild nausea or  vomiting. This should improve after the first 24 hours.  Bloating and possibly shoulder pain from the gas used during the procedure. This will improve after the first 24 hours. HOME CARE INSTRUCTIONS   Change bandages (dressings) as directed by your health care provider.  Keep the wound dry and clean. You may wash the wound gently with soap and water. Gently blot or dab the area dry.  Do not take baths or use swimming pools or hot tubs for 2 weeks or until your health care provider approves.  Only take over-the-counter or prescription medicines as directed by your health care provider.  Continue your normal diet as directed by your health care provider.  Do not lift anything heavier than 10 pounds (4.5 kg) until your health care provider approves.  Do not play contact sports for 1 week or until your health care provider approves. SEEK MEDICAL CARE IF:   You have redness, swelling, or increasing pain in the wound.  You notice yellowish-white fluid (pus) coming from the wound.  You have drainage from the wound that lasts longer than 1 day.  You notice a bad smell coming from the wound or dressing.  Your surgical cuts (incisions) break open. SEEK IMMEDIATE MEDICAL CARE IF:   You develop a rash.  You have difficulty breathing.  You have chest pain.  You have a fever.  You have increasing pain in the shoulders (shoulder strap areas).  You have dizzy episodes or faint while standing.  You have severe abdominal pain.  You feel sick to your stomach (nauseous) or throw up (vomit) and this lasts for more than 1 day. Document Released: 02/06/2005 Document Revised: 11/27/2012 Document Reviewed: 09/18/2012 [facility] Patient Information 2015 [place], [place]. This information is not intended to replace advice given to you by your health care provider. Make sure you discuss any questions you have with your health care provider. Laparoscopic Cholecystectomy Laparoscopic cholecystectomy  is surgery to remove the gallbladder. The gallbladder is located in the upper right part of the abdomen, behind the liver. It is a storage sac for bile produced in the liver. Bile aids in the digestion and absorption of fats. Cholecystectomy is often done for inflammation of the gallbladder (cholecystitis). This condition is usually caused by a buildup of gallstones (cholelithiasis) in your gallbladder. Gallstones can block the flow of bile, resulting in inflammation and pain. In severe cases, emergency surgery may be required. When emergency surgery is not required, you will have time to prepare for the procedure. Laparoscopic surgery is an alternative to open surgery. Laparoscopic surgery has a shorter recovery time. Your common bile duct may also need to be examined during the procedure. If stones are found in the common bile duct, they may be removed. LET [facility] CARE PROVIDER KNOW ABOUT:  Any allergies you have.  All medicines you are taking, including vitamins,  herbs, eye drops, creams, and over-the-counter medicines.  Previous problems you or members of your family have had with the use of anesthetics.  Any blood disorders you have.  Previous surgeries you have had.  Medical conditions you have. RISKS AND COMPLICATIONS Generally, this is a safe procedure. However, as with any procedure, complications can occur. Possible complications include:  Infection.  Damage to the common bile duct, nerves, arteries, veins, or other internal organs such as the stomach, liver, or intestines.  Bleeding.  A stone may remain in the common bile duct.  A bile leak from the cyst duct that is clipped when your gallbladder is removed.  The need to convert to open surgery, which requires a larger incision in the abdomen. This may be necessary if your surgeon thinks it is not safe to continue with a laparoscopic procedure. BEFORE THE PROCEDURE  Ask your health care provider about changing or  stopping any regular medicines. You will need to stop taking aspirin or blood thinners at least 5 days prior to surgery.  Do not eat or drink anything after midnight the night before surgery.  Let your health care provider know if you develop a cold or other infectious problem before surgery. PROCEDURE   You will be given medicine to make you sleep through the procedure (general anesthetic). A breathing tube will be placed in your mouth.  When you are asleep, your surgeon will make several small cuts (incisions) in your abdomen.  A thin, lighted tube with a tiny camera on the end (laparoscope) is inserted through one of the small incisions. The camera on the laparoscope sends a picture to a TV screen in the operating room. This gives the surgeon a good view inside your abdomen.  A gas will be pumped into your abdomen. This expands your abdomen so that the surgeon has more room to perform the surgery.  Other tools needed for the procedure are inserted through the other incisions. The gallbladder is removed through one of the incisions.  After the removal of your gallbladder, the incisions will be closed with stitches, staples, or skin glue. AFTER THE PROCEDURE  You will be taken to a recovery area where your progress will be checked often.  You may be allowed to go home the same day if your pain is controlled and you can tolerate liquids. Document Released: 02/06/2005 Document Revised: 11/27/2012 Document Reviewed: 09/18/2012 [facility] Patient Information 2015 [place], [place]. This information is not intended to replace advice given to you by your health care provider. Make sure you discuss any questions you have with your health care provider.

## 2013-10-28 ENCOUNTER — Encounter (HOSPITAL_COMMUNITY): Payer: Self-pay

## 2013-10-28 ENCOUNTER — Encounter (HOSPITAL_COMMUNITY)
Admission: RE | Admit: 2013-10-28 | Discharge: 2013-10-28 | Disposition: A | Discharge status: Home / Self Care | Payer: PRIVATE HEALTH INSURANCE | Source: Ambulatory Visit | Attending: General Surgery | Admitting: General Surgery

## 2013-10-28 DIAGNOSIS — Z01818 Encounter for other preprocedural examination: Secondary | ICD-10-CM | POA: Insufficient documentation

## 2013-10-28 DIAGNOSIS — K8010 Calculus of gallbladder with chronic cholecystitis without obstruction: Secondary | ICD-10-CM | POA: Insufficient documentation

## 2013-10-28 HISTORY — DX: Gastro-esophageal reflux disease without esophagitis: K21.9

## 2013-10-28 LAB — CBC WITH DIFFERENTIAL/PLATELET
BASOS ABS: 0 10*3/uL (ref 0.0–0.1)
BASOS PCT: 0 % (ref 0–1)
Eosinophils Absolute: 0.3 10*3/uL (ref 0.0–0.7)
Eosinophils Relative: 2 % (ref 0–5)
HCT: 41.8 % (ref 36.0–46.0)
HEMOGLOBIN: 14 g/dL (ref 12.0–15.0)
Lymphocytes Relative: 22 % (ref 12–46)
Lymphs Abs: 2.9 10*3/uL (ref 0.7–4.0)
MCH: 32 pg (ref 26.0–34.0)
MCHC: 33.5 g/dL (ref 30.0–36.0)
MCV: 95.7 fL (ref 78.0–100.0)
MONOS PCT: 7 % (ref 3–12)
Monocytes Absolute: 0.9 10*3/uL (ref 0.1–1.0)
NEUTROS ABS: 9 10*3/uL — AB (ref 1.7–7.7)
Neutrophils Relative %: 69 % (ref 43–77)
PLATELETS: 309 10*3/uL (ref 150–400)
RBC: 4.37 MIL/uL (ref 3.87–5.11)
RDW: 13.5 % (ref 11.5–15.5)
WBC: 13 10*3/uL — ABNORMAL HIGH (ref 4.0–10.5)

## 2013-10-28 LAB — BASIC METABOLIC PANEL
ANION GAP: 17 — AB (ref 5–15)
BUN: 16 mg/dL (ref 6–23)
CHLORIDE: 97 meq/L (ref 96–112)
CO2: 26 mEq/L (ref 19–32)
Calcium: 9.6 mg/dL (ref 8.4–10.5)
Creatinine, Ser: 0.97 mg/dL (ref 0.50–1.10)
GFR calc Af Amer: 78 mL/min — ABNORMAL LOW (ref >90)
GFR, EST NON AFRICAN AMERICAN: 67 mL/min — AB (ref >90)
Glucose, Bld: 110 mg/dL — ABNORMAL HIGH (ref 70–99)
POTASSIUM: 4.1 meq/L (ref 3.7–5.3)
Sodium: 140 mEq/L (ref 137–147)

## 2013-10-28 LAB — HEPATIC FUNCTION PANEL
ALBUMIN: 3.9 g/dL (ref 3.5–5.2)
ALT: 18 U/L (ref 0–35)
AST: 17 U/L (ref 0–37)
Alkaline Phosphatase: 82 U/L (ref 39–117)
Bilirubin, Direct: <0.2 mg/dL (ref 0.0–0.3)
TOTAL PROTEIN: 7.2 g/dL (ref 6.0–8.3)

## 2013-10-31 ENCOUNTER — Encounter (HOSPITAL_COMMUNITY): Payer: Self-pay | Admitting: *Deleted

## 2013-10-31 ENCOUNTER — Ambulatory Visit (HOSPITAL_COMMUNITY)
Admission: RE | Admit: 2013-10-31 | Discharge: 2013-10-31 | Disposition: A | Discharge status: Home / Self Care | Payer: PRIVATE HEALTH INSURANCE | Source: Ambulatory Visit | Attending: General Surgery | Admitting: General Surgery

## 2013-10-31 ENCOUNTER — Ambulatory Visit (HOSPITAL_COMMUNITY): Payer: PRIVATE HEALTH INSURANCE | Admitting: Anesthesiology

## 2013-10-31 ENCOUNTER — Encounter (HOSPITAL_COMMUNITY)
Admission: RE | Disposition: A | Discharge status: Home / Self Care | Payer: Self-pay | Source: Ambulatory Visit | Attending: General Surgery

## 2013-10-31 ENCOUNTER — Encounter (HOSPITAL_COMMUNITY): Payer: Self-pay | Admitting: Anesthesiology

## 2013-10-31 DIAGNOSIS — Z791 Long term (current) use of non-steroidal anti-inflammatories (NSAID): Secondary | ICD-10-CM | POA: Insufficient documentation

## 2013-10-31 DIAGNOSIS — K8010 Calculus of gallbladder with chronic cholecystitis without obstruction: Secondary | ICD-10-CM | POA: Insufficient documentation

## 2013-10-31 DIAGNOSIS — Z7982 Long term (current) use of aspirin: Secondary | ICD-10-CM | POA: Insufficient documentation

## 2013-10-31 DIAGNOSIS — Z79899 Other long term (current) drug therapy: Secondary | ICD-10-CM | POA: Insufficient documentation

## 2013-10-31 DIAGNOSIS — K219 Gastro-esophageal reflux disease without esophagitis: Secondary | ICD-10-CM | POA: Insufficient documentation

## 2013-10-31 DIAGNOSIS — I10 Essential (primary) hypertension: Secondary | ICD-10-CM | POA: Insufficient documentation

## 2013-10-31 HISTORY — PX: CHOLECYSTECTOMY: SHX55

## 2013-10-31 SURGERY — LAPAROSCOPIC CHOLECYSTECTOMY
Anesthesia: General | Site: Abdomen

## 2013-10-31 MED ORDER — SUCCINYLCHOLINE CHLORIDE 20 MG/ML IJ SOLN
INTRAMUSCULAR | Status: AC
  Filled 2013-10-31: qty 1

## 2013-10-31 MED ORDER — DEXAMETHASONE SODIUM PHOSPHATE 4 MG/ML IJ SOLN
INTRAMUSCULAR | Status: AC
  Filled 2013-10-31: qty 1

## 2013-10-31 MED ORDER — LACTATED RINGERS IV SOLN
INTRAVENOUS | Status: DC
  Administered 2013-10-31: 07:00:00 via INTRAVENOUS

## 2013-10-31 MED ORDER — GLYCOPYRROLATE 0.2 MG/ML IJ SOLN
INTRAMUSCULAR | Status: AC
  Filled 2013-10-31: qty 3

## 2013-10-31 MED ORDER — SUCCINYLCHOLINE CHLORIDE 20 MG/ML IJ SOLN
INTRAMUSCULAR | Status: DC | PRN
  Administered 2013-10-31: 120 mg via INTRAVENOUS

## 2013-10-31 MED ORDER — KETOROLAC TROMETHAMINE 30 MG/ML IJ SOLN
30.0000 mg | Freq: Once | INTRAMUSCULAR | Status: AC
  Administered 2013-10-31: 30 mg via INTRAVENOUS
  Filled 2013-10-31: qty 1

## 2013-10-31 MED ORDER — SODIUM CHLORIDE 0.9 % IJ SOLN
INTRAMUSCULAR | Status: AC
  Filled 2013-10-31: qty 10

## 2013-10-31 MED ORDER — ONDANSETRON HCL 4 MG/2ML IJ SOLN: 4.0000 mg | Freq: Once | INTRAMUSCULAR | Status: DC | PRN

## 2013-10-31 MED ORDER — ROCURONIUM BROMIDE 50 MG/5ML IV SOLN
INTRAVENOUS | Status: AC
  Filled 2013-10-31: qty 1

## 2013-10-31 MED ORDER — FENTANYL CITRATE 0.05 MG/ML IJ SOLN
INTRAMUSCULAR | Status: DC | PRN
  Administered 2013-10-31 (×5): 50 ug via INTRAVENOUS

## 2013-10-31 MED ORDER — FENTANYL CITRATE 0.05 MG/ML IJ SOLN
INTRAMUSCULAR | Status: AC
  Filled 2013-10-31: qty 5

## 2013-10-31 MED ORDER — GLYCOPYRROLATE 0.2 MG/ML IJ SOLN
INTRAMUSCULAR | Status: AC
  Filled 2013-10-31: qty 1

## 2013-10-31 MED ORDER — POVIDONE-IODINE 10 % EX OINT
TOPICAL_OINTMENT | CUTANEOUS | Status: AC
  Filled 2013-10-31: qty 2

## 2013-10-31 MED ORDER — BUPIVACAINE HCL (PF) 0.5 % IJ SOLN
INTRAMUSCULAR | Status: DC | PRN
  Administered 2013-10-31: 10 mL

## 2013-10-31 MED ORDER — FENTANYL CITRATE 0.05 MG/ML IJ SOLN: 25.0000 ug | INTRAMUSCULAR | Status: DC | PRN

## 2013-10-31 MED ORDER — DEXAMETHASONE SODIUM PHOSPHATE 4 MG/ML IJ SOLN
4.0000 mg | Freq: Once | INTRAMUSCULAR | Status: AC
  Administered 2013-10-31: 4 mg via INTRAVENOUS

## 2013-10-31 MED ORDER — MIDAZOLAM HCL 2 MG/2ML IJ SOLN
1.0000 mg | INTRAMUSCULAR | Status: DC | PRN
  Administered 2013-10-31 (×2): 2 mg via INTRAVENOUS
  Filled 2013-10-31: qty 2

## 2013-10-31 MED ORDER — LABETALOL HCL 5 MG/ML IV SOLN
INTRAVENOUS | Status: AC
  Filled 2013-10-31: qty 4

## 2013-10-31 MED ORDER — PROPOFOL 10 MG/ML IV BOLUS
INTRAVENOUS | Status: DC | PRN
  Administered 2013-10-31: 150 mg via INTRAVENOUS

## 2013-10-31 MED ORDER — EPHEDRINE SULFATE 50 MG/ML IJ SOLN
INTRAMUSCULAR | Status: AC
  Filled 2013-10-31: qty 1

## 2013-10-31 MED ORDER — HEMOSTATIC AGENTS (NO CHARGE) OPTIME
TOPICAL | Status: DC | PRN
  Administered 2013-10-31: 1 via TOPICAL

## 2013-10-31 MED ORDER — GLYCOPYRROLATE 0.2 MG/ML IJ SOLN
INTRAMUSCULAR | Status: DC | PRN
  Administered 2013-10-31: 0.6 mg via INTRAVENOUS

## 2013-10-31 MED ORDER — HYDROCODONE-ACETAMINOPHEN 5-325 MG PO TABS: 1.0000 | ORAL_TABLET | ORAL | Status: AC | PRN

## 2013-10-31 MED ORDER — LABETALOL HCL 5 MG/ML IV SOLN
INTRAVENOUS | Status: DC | PRN
  Administered 2013-10-31: 2.5 mg via INTRAVENOUS

## 2013-10-31 MED ORDER — MIDAZOLAM HCL 2 MG/2ML IJ SOLN
INTRAMUSCULAR | Status: AC
  Filled 2013-10-31: qty 2

## 2013-10-31 MED ORDER — SODIUM CHLORIDE 0.9 % IR SOLN
Status: DC | PRN
  Administered 2013-10-31: 1000 mL

## 2013-10-31 MED ORDER — LIDOCAINE HCL (CARDIAC) 20 MG/ML IV SOLN
INTRAVENOUS | Status: DC | PRN
  Administered 2013-10-31: 50 mg via INTRAVENOUS

## 2013-10-31 MED ORDER — PROPOFOL 10 MG/ML IV EMUL
INTRAVENOUS | Status: AC
  Filled 2013-10-31: qty 20

## 2013-10-31 MED ORDER — POVIDONE-IODINE 10 % OINT PACKET
TOPICAL_OINTMENT | CUTANEOUS | Status: DC | PRN
  Administered 2013-10-31: 2 via TOPICAL

## 2013-10-31 MED ORDER — ROCURONIUM BROMIDE 100 MG/10ML IV SOLN
INTRAVENOUS | Status: DC | PRN
  Administered 2013-10-31: 15 mg via INTRAVENOUS
  Administered 2013-10-31: 10 mg via INTRAVENOUS

## 2013-10-31 MED ORDER — LIDOCAINE HCL (PF) 1 % IJ SOLN
INTRAMUSCULAR | Status: AC
  Filled 2013-10-31: qty 5

## 2013-10-31 MED ORDER — DEXTROSE 5 % IV SOLN
2.0000 g | INTRAVENOUS | Status: AC
  Administered 2013-10-31: 2 g via INTRAVENOUS
  Filled 2013-10-31: qty 2

## 2013-10-31 MED ORDER — ONDANSETRON HCL 4 MG/2ML IJ SOLN
INTRAMUSCULAR | Status: AC
  Filled 2013-10-31: qty 2

## 2013-10-31 MED ORDER — ARTIFICIAL TEARS OP OINT
TOPICAL_OINTMENT | OPHTHALMIC | Status: DC | PRN
  Administered 2013-10-31: 1 via OPHTHALMIC

## 2013-10-31 MED ORDER — BUPIVACAINE HCL (PF) 0.5 % IJ SOLN
INTRAMUSCULAR | Status: AC
  Filled 2013-10-31: qty 30

## 2013-10-31 MED ORDER — NEOSTIGMINE METHYLSULFATE 10 MG/10ML IV SOLN
INTRAVENOUS | Status: DC | PRN
  Administered 2013-10-31: 4 mg via INTRAVENOUS

## 2013-10-31 MED ORDER — CHLORHEXIDINE GLUCONATE 4 % EX LIQD: 1.0000 "application " | Freq: Once | CUTANEOUS | Status: DC

## 2013-10-31 MED ORDER — LACTATED RINGERS IV SOLN
INTRAVENOUS | Status: DC | PRN
  Administered 2013-10-31 (×2): via INTRAVENOUS

## 2013-10-31 MED ORDER — NEOSTIGMINE METHYLSULFATE 10 MG/10ML IV SOLN
INTRAVENOUS | Status: AC
  Filled 2013-10-31: qty 1

## 2013-10-31 MED ORDER — ARTIFICIAL TEARS OP OINT
TOPICAL_OINTMENT | OPHTHALMIC | Status: AC
  Filled 2013-10-31: qty 3.5

## 2013-10-31 MED ORDER — ONDANSETRON HCL 4 MG/2ML IJ SOLN
4.0000 mg | Freq: Once | INTRAMUSCULAR | Status: AC
  Administered 2013-10-31: 4 mg via INTRAVENOUS

## 2013-10-31 SURGICAL SUPPLY — 43 items
APPLIER CLIP LAPSCP 10X32 DD (CLIP) ×3 IMPLANT
BAG HAMPER (MISCELLANEOUS) ×3 IMPLANT
BLADE 11 SAFETY STRL DISP (BLADE) ×3 IMPLANT
CLOTH BEACON ORANGE TIMEOUT ST (SAFETY) ×3 IMPLANT
COVER LIGHT HANDLE STERIS (MISCELLANEOUS) ×6 IMPLANT
DECANTER SPIKE VIAL GLASS SM (MISCELLANEOUS) ×3 IMPLANT
DURAPREP 26ML APPLICATOR (WOUND CARE) ×3 IMPLANT
ELECT REM PT RETURN 9FT ADLT (ELECTROSURGICAL) ×3
ELECTRODE REM PT RTRN 9FT ADLT (ELECTROSURGICAL) ×1 IMPLANT
FILTER SMOKE EVAC LAPAROSHD (FILTER) ×3 IMPLANT
FORMALIN 10 PREFIL 120ML (MISCELLANEOUS) ×3 IMPLANT
GLOVE BIOGEL PI IND STRL 7.0 (GLOVE) ×1 IMPLANT
GLOVE BIOGEL PI IND STRL 7.5 (GLOVE) ×1 IMPLANT
GLOVE BIOGEL PI INDICATOR 7.0 (GLOVE) ×2
GLOVE BIOGEL PI INDICATOR 7.5 (GLOVE) ×2
GLOVE ECLIPSE 6.5 STRL STRAW (GLOVE) ×3 IMPLANT
GLOVE EXAM NITRILE MD LF STRL (GLOVE) ×3 IMPLANT
GLOVE SURG SS PI 7.5 STRL IVOR (GLOVE) ×6 IMPLANT
GOWN STRL REUS W/TWL LRG LVL3 (GOWN DISPOSABLE) ×9 IMPLANT
HEMOSTAT SNOW SURGICEL 2X4 (HEMOSTASIS) ×3 IMPLANT
INST SET LAPROSCOPIC AP (KITS) ×3 IMPLANT
IV NS IRRIG 3000ML ARTHROMATIC (IV SOLUTION) IMPLANT
KIT ROOM TURNOVER APOR (KITS) ×3 IMPLANT
MANIFOLD NEPTUNE II (INSTRUMENTS) ×3 IMPLANT
NEEDLE INSUFFLATION 14GA 120MM (NEEDLE) ×3 IMPLANT
NS IRRIG 1000ML POUR BTL (IV SOLUTION) ×3 IMPLANT
PACK LAP CHOLE LZT030E (CUSTOM PROCEDURE TRAY) ×3 IMPLANT
PAD ARMBOARD 7.5X6 YLW CONV (MISCELLANEOUS) ×3 IMPLANT
POUCH SPECIMEN RETRIEVAL 10MM (ENDOMECHANICALS) ×3 IMPLANT
SET BASIN LINEN APH (SET/KITS/TRAYS/PACK) ×3 IMPLANT
SET TUBE IRRIG SUCTION NO TIP (IRRIGATION / IRRIGATOR) IMPLANT
SLEEVE ENDOPATH XCEL 5M (ENDOMECHANICALS) ×3 IMPLANT
SPONGE GAUZE 2X2 8PLY STER LF (GAUZE/BANDAGES/DRESSINGS) ×4
SPONGE GAUZE 2X2 8PLY STRL LF (GAUZE/BANDAGES/DRESSINGS) ×8 IMPLANT
STAPLER VISISTAT (STAPLE) ×3 IMPLANT
SUT VICRYL 0 UR6 27IN ABS (SUTURE) ×3 IMPLANT
TAPE CLOTH SURG 4X10 WHT LF (GAUZE/BANDAGES/DRESSINGS) ×3 IMPLANT
TROCAR ENDO BLADELESS 11MM (ENDOMECHANICALS) ×3 IMPLANT
TROCAR XCEL NON-BLD 5MMX100MML (ENDOMECHANICALS) ×3 IMPLANT
TROCAR XCEL UNIV SLVE 11M 100M (ENDOMECHANICALS) ×3 IMPLANT
TUBING INSUFFLATION (TUBING) ×3 IMPLANT
WARMER LAPAROSCOPE (MISCELLANEOUS) ×3 IMPLANT
YANKAUER SUCT 12FT TUBE ARGYLE (SUCTIONS) ×3 IMPLANT

## 2013-10-31 NOTE — Anesthesia Postprocedure Evaluation (Signed)
  Anesthesia Post-op Note  Patient: Tiffany Barker  Procedure(s) Performed: Procedure(s): LAPAROSCOPIC CHOLECYSTECTOMY (N/A)  Patient Location: PACU  Anesthesia Type:General  Level of Consciousness: awake, alert , oriented and patient cooperative  Airway and Oxygen Therapy: Patient Spontanous Breathing and Patient connected to face mask oxygen  Post-op Pain: mild  Post-op Assessment: Post-op Vital signs reviewed, Patient's Cardiovascular Status Stable, Respiratory Function Stable, Patent Airway, No signs of Nausea or vomiting and Pain level controlled  Post-op Vital Signs: Reviewed and stable  Last Vitals:  Filed Vitals:   10/31/13 0730  BP: 139/101  Pulse:   Temp:   Resp: 26    Complications: No apparent anesthesia complications

## 2013-10-31 NOTE — Anesthesia Procedure Notes (Signed)
Procedure Name: Intubation Date/Time: 10/31/2013 7:40 AM Performed by: Andree Elk, Byard Carranza A Pre-anesthesia Checklist: Patient identified, Patient being monitored, Timeout performed, Emergency Drugs available and Suction available Patient Re-evaluated:Patient Re-evaluated prior to inductionOxygen Delivery Method: Circle System Utilized Preoxygenation: Pre-oxygenation with 100% oxygen Intubation Type: IV induction Ventilation: Mask ventilation without difficulty Laryngoscope Size: 3 and Miller Grade View: Grade I Tube type: Oral Tube size: 7.0 mm Number of attempts: 1 Airway Equipment and Method: stylet Placement Confirmation: ETT inserted through vocal cords under direct vision,  positive ETCO2 and breath sounds checked- equal and bilateral Secured at: 21 cm Tube secured with: Tape Dental Injury: Teeth and Oropharynx as per pre-operative assessment

## 2013-10-31 NOTE — Anesthesia Preprocedure Evaluation (Signed)
Anesthesia Evaluation  Patient identified by MRN, date of birth, ID band Patient awake    Reviewed: Allergy & Precautions, H&P , NPO status , Patient's Chart, lab work & pertinent test results, reviewed documented beta blocker date and time   Airway Mallampati: II TM Distance: >3 FB Neck ROM: Full    Dental  (+) Teeth Intact   Pulmonary former smoker,  breath sounds clear to auscultation        Cardiovascular hypertension, Pt. on home beta blockers and Pt. on medications Rhythm:Regular Rate:Normal     Neuro/Psych PSYCHIATRIC DISORDERS Anxiety    GI/Hepatic GERD-  Medicated,  Endo/Other    Renal/GU      Musculoskeletal   Abdominal   Peds  Hematology   Anesthesia Other Findings   Reproductive/Obstetrics                           Anesthesia Physical Anesthesia Plan  ASA: II  Anesthesia Plan: General   Post-op Pain Management:    Induction: Intravenous  Airway Management Planned: Oral ETT  Additional Equipment:   Intra-op Plan:   Post-operative Plan: Extubation in OR  Informed Consent: I have reviewed the patients History and Physical, chart, labs and discussed the procedure including the risks, benefits and alternatives for the proposed anesthesia with the patient or authorized representative who has indicated his/her understanding and acceptance.     Plan Discussed with:   Anesthesia Plan Comments:         Anesthesia Quick Evaluation

## 2013-10-31 NOTE — Interval H&P Note (Signed)
History and Physical Interval Note:  10/31/2013 7:29 AM  Tiffany Barker  has presented today for surgery, with the diagnosis of cholelithiasis  The various methods of treatment have been discussed with the patient and family. After consideration of risks, benefits and other options for treatment, the patient has consented to  Procedure(s): LAPAROSCOPIC CHOLECYSTECTOMY (N/A) as a surgical intervention .  The patient's history has been reviewed, patient examined, no change in status, stable for surgery.  I have reviewed the patient's chart and labs.  Questions were answered to the patient's satisfaction.     Aviva Signs A

## 2013-10-31 NOTE — Op Note (Signed)
Patient:  Tiffany Barker  DOB:  1960-10-05  MRN:  [id number]   Preop Diagnosis:  Cholecystitis, cholelithiasis  Postop Diagnosis:  Same  Procedure:  Laparoscopic cholecystectomy  Surgeon:  Aviva Signs, M.D.  Anes:  General endotracheal  Indications:  Patient is a 53 year old white female presents with biliary colic secondary to cholelithiasis. The risks and benefits of the procedure including bleeding, infection, hepatobiliary injury, the possibility of an open procedure were fully explained to the patient, who gave informed consent.  Procedure note:  The patient is placed the supine position. After induction of general endotracheal anesthesia, the abdomen was prepped and draped using usual sterile technique with DuraPrep. Surgical site confirmation was performed.  A supraumbilical incision was made down to the fascia. A Veress needle was introduced into the abdominal cavity and confirmation of placement was done using the saline drop test. The abdomen was then insufflated to 16 mm mercury pressure. An 11 mm trocar was introduced into the abdominal cavity under direct visualization without difficulty. The patient was placed in reverse Trendelenburg position and additional 11 mm trocar was placed the epigastric region and 5 mm trochars were placed the right upper quadrant and right flank regions. The liver was inspected and noted within normal limits. The gallbladder was retracted in a dynamic fashion in order to expose the triangle of Calot. The cystic duct was first identified. Its juncture to the infundibulum was fully identified. Endoclips placed proximally and distally on the cystic duct, and the cystic duct was divided. This was likewise done the cystic artery. The gallbladder was freed away from the gallbladder fossa using Bovie electrocautery. The gallbladder was delivered through the epigastric trocar site using an Endo Catch bag. The gallbladder fossa was inspected and no abnormal  bleeding or bile leakage was noted. Surgicel is placed the gallbladder fossa. All fluid and air were then evacuated from the abdominal cavity prior to removal of the trochars.  All wounds were irrigated with normal saline. All wounds were injected with 0.5% Sensorcaine. The suprabuccal fashion as reapproximated using an 0 Vicryl interrupted suture. All skin incisions were closed using staples. Betadine ointment and dry sterile dressings were applied.  All tape and needle counts were correct at the end of the procedure. Patient was extubated in the operating room and transferred to PACU in stable condition.  Complications:  None  EBL:  Minimal  Specimen:  Gallbladder

## 2013-10-31 NOTE — Transfer of Care (Signed)
Immediate Anesthesia Transfer of Care Note  Patient: Tiffany Barker  Procedure(s) Performed: Procedure(s): LAPAROSCOPIC CHOLECYSTECTOMY (N/A)  Patient Location: PACU  Anesthesia Type:General  Level of Consciousness: awake, alert , oriented and patient cooperative  Airway & Oxygen Therapy: Patient Spontanous Breathing and Patient connected to face mask oxygen  Post-op Assessment: Report given to PACU RN and Post -op Vital signs reviewed and stable  Post vital signs: Reviewed and stable  Complications: No apparent anesthesia complications

## 2013-10-31 NOTE — Discharge Instructions (Signed)

## 2013-11-03 ENCOUNTER — Encounter (HOSPITAL_COMMUNITY): Payer: Self-pay | Admitting: General Surgery

## 2013-12-22 ENCOUNTER — Encounter (HOSPITAL_COMMUNITY): Payer: Self-pay | Admitting: General Surgery

## 2015-09-13 ENCOUNTER — Emergency Department (HOSPITAL_COMMUNITY): Payer: PRIVATE HEALTH INSURANCE

## 2015-09-13 ENCOUNTER — Emergency Department (HOSPITAL_COMMUNITY)
Admission: EM | Admit: 2015-09-13 | Discharge: 2015-09-13 | Disposition: A | Discharge status: Home / Self Care | Payer: PRIVATE HEALTH INSURANCE | Attending: Emergency Medicine | Admitting: Emergency Medicine

## 2015-09-13 ENCOUNTER — Encounter (HOSPITAL_COMMUNITY): Payer: Self-pay

## 2015-09-13 DIAGNOSIS — Z7982 Long term (current) use of aspirin: Secondary | ICD-10-CM | POA: Insufficient documentation

## 2015-09-13 DIAGNOSIS — R079 Chest pain, unspecified: Secondary | ICD-10-CM

## 2015-09-13 DIAGNOSIS — R519 Headache, unspecified: Secondary | ICD-10-CM

## 2015-09-13 DIAGNOSIS — I10 Essential (primary) hypertension: Secondary | ICD-10-CM | POA: Insufficient documentation

## 2015-09-13 DIAGNOSIS — Z87891 Personal history of nicotine dependence: Secondary | ICD-10-CM | POA: Insufficient documentation

## 2015-09-13 DIAGNOSIS — R072 Precordial pain: Secondary | ICD-10-CM | POA: Insufficient documentation

## 2015-09-13 DIAGNOSIS — Z79899 Other long term (current) drug therapy: Secondary | ICD-10-CM | POA: Insufficient documentation

## 2015-09-13 DIAGNOSIS — R51 Headache: Secondary | ICD-10-CM | POA: Insufficient documentation

## 2015-09-13 LAB — CBC WITH DIFFERENTIAL/PLATELET
Basophils Absolute: 0 10*3/uL (ref 0.0–0.1)
Basophils Relative: 0 %
Eosinophils Absolute: 0.2 10*3/uL (ref 0.0–0.7)
Eosinophils Relative: 1 %
HCT: 39.4 % (ref 36.0–46.0)
HEMOGLOBIN: 12.7 g/dL (ref 12.0–15.0)
LYMPHS ABS: 2.5 10*3/uL (ref 0.7–4.0)
Lymphocytes Relative: 23 %
MCH: 32.1 pg (ref 26.0–34.0)
MCHC: 32.2 g/dL (ref 30.0–36.0)
MCV: 99.5 fL (ref 78.0–100.0)
Monocytes Absolute: 0.9 10*3/uL (ref 0.1–1.0)
Monocytes Relative: 8 %
Neutro Abs: 7.4 10*3/uL (ref 1.7–7.7)
Neutrophils Relative %: 68 %
Platelets: 287 10*3/uL (ref 150–400)
RBC: 3.96 MIL/uL (ref 3.87–5.11)
RDW: 13.1 % (ref 11.5–15.5)
WBC: 11 10*3/uL — ABNORMAL HIGH (ref 4.0–10.5)

## 2015-09-13 LAB — HEPATIC FUNCTION PANEL
ALK PHOS: 65 U/L (ref 38–126)
ALT: 18 U/L (ref 14–54)
AST: 16 U/L (ref 15–41)
Albumin: 3.8 g/dL (ref 3.5–5.0)
Total Bilirubin: 0.3 mg/dL (ref 0.3–1.2)
Total Protein: 6.8 g/dL (ref 6.5–8.1)

## 2015-09-13 LAB — BASIC METABOLIC PANEL
Anion gap: 5 (ref 5–15)
BUN: 13 mg/dL (ref 6–20)
CALCIUM: 8.9 mg/dL (ref 8.9–10.3)
CO2: 28 mmol/L (ref 22–32)
CREATININE: 1.05 mg/dL — AB (ref 0.44–1.00)
Chloride: 107 mmol/L (ref 101–111)
GFR calc non Af Amer: 60 mL/min — ABNORMAL LOW (ref >60)
Glucose, Bld: 105 mg/dL — ABNORMAL HIGH (ref 65–99)
Potassium: 4.2 mmol/L (ref 3.5–5.1)
SODIUM: 140 mmol/L (ref 135–145)

## 2015-09-13 LAB — TROPONIN I

## 2015-09-13 MED ORDER — KETOROLAC TROMETHAMINE 30 MG/ML IJ SOLN
30.0000 mg | Freq: Once | INTRAMUSCULAR | Status: AC
  Administered 2015-09-13: 30 mg via INTRAVENOUS
  Filled 2015-09-13: qty 1

## 2015-09-13 MED ORDER — METOCLOPRAMIDE HCL 5 MG/ML IJ SOLN
10.0000 mg | Freq: Once | INTRAMUSCULAR | Status: AC
  Administered 2015-09-13: 10 mg via INTRAVENOUS
  Filled 2015-09-13: qty 2

## 2015-09-13 MED ORDER — DIPHENHYDRAMINE HCL 50 MG/ML IJ SOLN
25.0000 mg | Freq: Once | INTRAMUSCULAR | Status: AC
  Administered 2015-09-13: 25 mg via INTRAVENOUS
  Filled 2015-09-13: qty 1

## 2015-09-13 MED ORDER — FAMOTIDINE IN NACL 20-0.9 MG/50ML-% IV SOLN
20.0000 mg | Freq: Once | INTRAVENOUS | Status: AC
Start: 2015-09-13 — End: 2015-09-13
  Administered 2015-09-13: 20 mg via INTRAVENOUS
  Filled 2015-09-13: qty 50

## 2015-09-13 NOTE — Discharge Instructions (Signed)
Follow up with your provider in 1 week

## 2015-09-13 NOTE — ED Triage Notes (Signed)
Patient stated she started having leg cramps last night. States when she got to work this morning she started having chest pain and a headache

## 2015-09-13 NOTE — ED Provider Notes (Signed)
[facility] DEPT Provider Note   CSN: [id number] Arrival date & time: 09/13/15  [id number]  By signing my name below, I, Emmanuella Mensah, attest that this documentation has been prepared under the direction and in the presence of Milton Ferguson, MD. Electronically Signed: Judithann Sauger, ED Scribe. 09/13/15. 9:22 AM.   History   Chief Complaint Chief Complaint  Patient presents with  . Chest Pain   HPI Comments: Tiffany Barker is a 55 y.o. female with a hx of panic attacks who presents to the Emergency Department complaining of non-radiating cramping substernal chest pain onset this am while pt was at work. She reports associated frontal headache and dizziness. No alleviating factors noted. Pt has not tried any medications PTA. She reports that she was admitted January 2017 for heart-related issues. She adds that she was seen by her PCP since then who told her that her BP was high and that she was getting dehydrated. She denies any SOB, cough, or leg swelling. No other complaints at this time.    The history is provided by the patient. No language interpreter was used.  Chest Pain   This is a new problem. The current episode started 1 to 2 hours ago. The problem has not changed since onset.The pain is present in the substernal region. The pain is at a severity of 4/10. The pain is moderate. The quality of the pain is described as brief and dull. The pain does not radiate. The symptoms are aggravated by certain positions. Associated symptoms include headaches. Pertinent negatives include no abdominal pain, no back pain and no cough. She has tried nothing for the symptoms. The treatment provided mild relief. Risk factors include stress.  Pertinent negatives for past medical history include no aneurysm and no seizures.  Her family medical history is significant for CAD and stroke.    Past Medical History:  Diagnosis Date  . Cholelithiasis   . Diverticulosis    Colonoscopy 2010 -  [place]  . Essential hypertension, benign   . GERD (gastroesophageal reflux disease)   . Panic attacks     Patient Active Problem List   Diagnosis Date Noted  . Mixed hyperlipidemia 12/22/2010  . Essential hypertension, benign 12/22/2010  . Chest pain 10/21/2010    Past Surgical History:  Procedure Laterality Date  . BACK SURGERY    . CARDIAC CATHETERIZATION  2011  . Chest cyst removal    . CHOLECYSTECTOMY N/A 10/31/2013   Procedure: LAPAROSCOPIC CHOLECYSTECTOMY;  Surgeon: Jamesetta So, MD;  Location: [facility];  Service: General;  Laterality: N/A;  . TUBAL LIGATION      OB History    Gravida Para Term Preterm AB Living   1 1 1     1    SAB TAB Ectopic Multiple Live Births                   Home Medications    Prior to Admission medications   Medication Sig Start Date End Date Taking? Authorizing Provider  aspirin EC 81 MG tablet Take 81 mg by mouth daily.    Historical Provider, MD  diphenhydrAMINE (BENADRYL) 25 mg capsule Take 25 mg by mouth at bedtime. Allergic to her cat    Historical Provider, MD  furosemide (LASIX) 20 MG tablet Take 20 mg by mouth daily.     Historical Provider, MD  metoprolol tartrate (LOPRESSOR) 25 MG tablet Take 25 mg by mouth 2 (two) times daily.    Historical Provider, MD  naproxen (  NAPROSYN) 500 MG tablet Take 500 mg by mouth 2 (two) times daily as needed (Pain).     Historical Provider, MD  Naproxen Sod-Diphenhydramine (ALEVE PM) 220-25 MG TABS Take 1 tablet by mouth at bedtime as needed (insomnia).    Historical Provider, MD  ranitidine (ZANTAC) 75 MG tablet Take 75 mg by mouth 2 (two) times daily.    Historical Provider, MD    Family History Family History  Problem Relation Age of Onset  . Coronary artery disease Mother     CAD in her 1's  . Coronary artery disease Father     CAD in his 65's  . Stroke Father     Social History Social History  Substance Use Topics  . Smoking status: Former Smoker    Packs/day: 0.50     Years: 2.50    Types: Cigarettes    Quit date: 02/21/1980  . Smokeless tobacco: Never Used  . Alcohol use Yes     Comment: Occasionaly/Once or twice a week (Beer)     Allergies   Wasp venom and Percocet [oxycodone-acetaminophen]   Review of Systems Review of Systems  Constitutional: Negative for appetite change and fatigue.  HENT: Negative for congestion, ear discharge and sinus pressure.   Eyes: Negative for discharge.  Respiratory: Negative for cough.   Cardiovascular: Positive for chest pain.  Gastrointestinal: Negative for abdominal pain and diarrhea.  Genitourinary: Negative for frequency and hematuria.  Musculoskeletal: Negative for back pain.  Skin: Negative for rash.  Neurological: Positive for headaches. Negative for seizures.  Psychiatric/Behavioral: Negative for hallucinations.     Physical Exam Updated Vital Signs BP 149/98 (BP Location: Left Arm)   Pulse (!) 58   Temp 97.8 F (36.6 C) (Oral)   Resp 10   LMP 06/30/2013   SpO2 100%   Physical Exam  Constitutional: She is oriented to person, place, and time. She appears well-developed.  HENT:  Head: Normocephalic.  Eyes: Conjunctivae and EOM are normal. No scleral icterus.  Neck: Neck supple. No thyromegaly present.  Cardiovascular: Normal rate and regular rhythm.  Exam reveals no gallop and no friction rub.   No murmur heard. Pulmonary/Chest: No stridor. She has no wheezes. She has no rales. She exhibits no tenderness.  Abdominal: She exhibits no distension. There is no tenderness. There is no rebound.  Musculoskeletal: Normal range of motion. She exhibits no edema.  Lymphadenopathy:    She has no cervical adenopathy.  Neurological: She is oriented to person, place, and time. She exhibits normal muscle tone. Coordination normal.  Skin: No rash noted. No erythema.  Psychiatric: She has a normal mood and affect. Her behavior is normal.     ED Treatments / Results  DIAGNOSTIC STUDIES: Oxygen  Saturation is 100% on RA, normal by my interpretation.    COORDINATION OF CARE: 9:21 AM- Pt advised of plan for treatment and pt agrees.    Labs (all labs ordered are listed, but only abnormal results are displayed) Labs Reviewed  BASIC METABOLIC PANEL  TROPONIN I  CBC WITH DIFFERENTIAL/PLATELET  HEPATIC FUNCTION PANEL    EKG  EKG Interpretation None       Radiology Dg Chest 2 View  Result Date: 09/13/2015 CLINICAL DATA:  Upper and lower extremity edema.  Chest pain EXAM: CHEST  2 VIEW COMPARISON:  February 28, 2015 FINDINGS: There is no edema or consolidation. The heart size and pulmonary vascularity are normal. No adenopathy. No bone lesions. No pneumothorax. IMPRESSION: No edema or consolidation. Electronically Signed  By: Lowella Grip III M.D.   On: 09/13/2015 09:05   Procedures Procedures (including critical care time)  Medications Ordered in ED Medications - No data to display   Initial Impression / Assessment and Plan / ED Course  Milton Ferguson, MD has reviewed the triage vital signs and the nursing notes.  Pertinent labs & imaging results that were available during my care of the patient were reviewed by me and considered in my medical decision making (see chart for details).  Clinical Course    Patient with chest pain and normal studies. Doubt coronary artery disease. Patient will follow-up with PCP. Patient also with headache. She has not taken her blood pressure medicine today. Headache could be possibly related to that  Final Clinical Impressions(s) / ED Diagnoses   Final diagnoses:  None   The chart was scribed for me under my direct supervision.  I personally performed the history, physical, and medical decision making and all procedures in the evaluation of this patient..   New Prescriptions New Prescriptions   No medications on file     Milton Ferguson, MD 09/13/15 1353

## 2015-10-11 ENCOUNTER — Encounter (HOSPITAL_COMMUNITY): Payer: Self-pay | Admitting: Emergency Medicine

## 2015-10-11 ENCOUNTER — Emergency Department (HOSPITAL_COMMUNITY)
Admission: EM | Admit: 2015-10-11 | Discharge: 2015-10-11 | Disposition: A | Discharge status: Home / Self Care | Payer: PRIVATE HEALTH INSURANCE | Attending: Emergency Medicine | Admitting: Emergency Medicine

## 2015-10-11 DIAGNOSIS — Z7982 Long term (current) use of aspirin: Secondary | ICD-10-CM | POA: Insufficient documentation

## 2015-10-11 DIAGNOSIS — I10 Essential (primary) hypertension: Secondary | ICD-10-CM | POA: Insufficient documentation

## 2015-10-11 DIAGNOSIS — Z87891 Personal history of nicotine dependence: Secondary | ICD-10-CM | POA: Insufficient documentation

## 2015-10-11 DIAGNOSIS — R42 Dizziness and giddiness: Secondary | ICD-10-CM

## 2015-10-11 DIAGNOSIS — Z791 Long term (current) use of non-steroidal anti-inflammatories (NSAID): Secondary | ICD-10-CM | POA: Insufficient documentation

## 2015-10-11 DIAGNOSIS — Z79899 Other long term (current) drug therapy: Secondary | ICD-10-CM | POA: Insufficient documentation

## 2015-10-11 MED ORDER — MECLIZINE HCL 25 MG PO TABS: 12.5000 mg | ORAL_TABLET | Freq: Three times a day (TID) | ORAL | 0 refills | Status: DC | PRN

## 2015-10-11 NOTE — ED Notes (Signed)
Pt reports dizziness upon lying, quickly moving head from side to side, or eyes. Denies LOC. A&O X4. Reports N/, HA, dizziness. 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

## 2015-10-11 NOTE — ED Triage Notes (Addendum)
Patient complaining of dizziness for over a week. Also complaining of headache that started upon arrival to ER. Patient states to put in chart that she wants "no strong pain medications" while she is a patient. Patient states she went to PCP today and was told she had vertigo.

## 2015-10-11 NOTE — ED Provider Notes (Signed)
[facility] DEPT Provider Note   CSN: [id number] Arrival date & time: 10/11/15  1217     History   Chief Complaint Chief Complaint  Patient presents with  . Dizziness    HPI Tiffany Barker is a 55 y.o. female.  HPI   55 year old female with what she calls dizziness. Intermittent. Has been ongoing for several days to about a week. She describes sensation of things spinning/moving. Typically worse with changes in position. Also worse with looking to her right side. No tinnitus. Denies acute pain. No dyspnea. She is seen in the health department yesterday and was diagnosed with vertigo. She was prescribed meclizine to take as needed. She tried taking this but says that this makes her feel drowsy but has not helped her symptoms otherwise.  Past Medical History:  Diagnosis Date  . Cholelithiasis   . Diverticulosis    Colonoscopy 2010 - [place]  . Essential hypertension, benign   . GERD (gastroesophageal reflux disease)   . Panic attacks     Patient Active Problem List   Diagnosis Date Noted  . Mixed hyperlipidemia 12/22/2010  . Essential hypertension, benign 12/22/2010  . Chest pain 10/21/2010    Past Surgical History:  Procedure Laterality Date  . BACK SURGERY    . CARDIAC CATHETERIZATION  2011  . Chest cyst removal    . CHOLECYSTECTOMY N/A 10/31/2013   Procedure: LAPAROSCOPIC CHOLECYSTECTOMY;  Surgeon: Jamesetta So, MD;  Location: [facility];  Service: General;  Laterality: N/A;  . TUBAL LIGATION      OB History    Gravida Para Term Preterm AB Living   1 1 1     1    SAB TAB Ectopic Multiple Live Births                   Home Medications    Prior to Admission medications   Medication Sig Start Date End Date Taking? Authorizing Provider  aspirin EC 81 MG tablet Take 81 mg by mouth daily.   Yes Historical Provider, MD  furosemide (LASIX) 20 MG tablet Take 20 mg by mouth daily.    Yes Historical Provider, MD  meclizine (ANTIVERT) 25 MG tablet Take  12.5-25 mg by mouth 3 (three) times daily as needed for dizziness.   Yes Historical Provider, MD  metoprolol tartrate (LOPRESSOR) 25 MG tablet Take 25 mg by mouth 2 (two) times daily.   Yes Historical Provider, MD  naproxen (NAPROSYN) 500 MG tablet Take 500 mg by mouth 2 (two) times daily as needed (Pain).    Yes Historical Provider, MD  Naproxen Sod-Diphenhydramine (ALEVE PM) 220-25 MG TABS Take 1 tablet by mouth at bedtime.    Yes Historical Provider, MD  ranitidine (ZANTAC) 75 MG tablet Take 75 mg by mouth 2 (two) times daily.   Yes Historical Provider, MD  meclizine (ANTIVERT) 25 MG tablet Take 0.5-1 tablets (12.5-25 mg total) by mouth 3 (three) times daily as needed for dizziness. 10/11/15   Virgel Manifold, MD    Family History Family History  Problem Relation Age of Onset  . Coronary artery disease Mother     CAD in her 79's  . Coronary artery disease Father     CAD in his 24's  . Stroke Father     Social History Social History  Substance Use Topics  . Smoking status: Former Smoker    Packs/day: 0.50    Years: 2.50    Types: Cigarettes    Quit date: 02/21/1980  .  Smokeless tobacco: Never Used  . Alcohol use Yes     Comment: Occasionaly/Once or twice a week (Beer)     Allergies   Wasp venom and Percocet [oxycodone-acetaminophen]   Review of Systems Review of Systems  All systems reviewed and negative, other than as noted in HPI.  Physical Exam Updated Vital Signs BP 125/82   Pulse 65   Temp 97.9 F (36.6 C) (Oral)   Resp 18   Ht 5\' 4"  (1.626 m)   Wt 201 lb (91.2 kg)   LMP 06/30/2013   SpO2 97%   BMI 34.50 kg/m   Physical Exam  Constitutional: She is oriented to person, place, and time. She appears well-developed and well-nourished. No distress.  HENT:  Head: Normocephalic and atraumatic.  Eyes: Conjunctivae are normal.  Neck: Neck supple.  Cardiovascular: Normal rate and regular rhythm.   No murmur heard. Pulmonary/Chest: Effort normal and breath sounds  normal. No respiratory distress.  Abdominal: Soft. There is no tenderness.  Musculoskeletal: She exhibits no edema.  Neurological: She is alert and oriented to person, place, and time. No cranial nerve deficit. She exhibits normal muscle tone. Coordination normal.  Good finger to nose b/l  Skin: Skin is warm and dry.  Psychiatric: She has a normal mood and affect.  Nursing note and vitals reviewed.    ED Treatments / Results  Labs (all labs ordered are listed, but only abnormal results are displayed) Labs Reviewed - No data to display  EKG  EKG Interpretation None       Radiology No results found.  Procedures Procedures (including critical care time)  Medications Ordered in ED Medications - No data to display   Initial Impression / Assessment and Plan / ED Course  I have reviewed the triage vital signs and the nursing notes.  Pertinent labs & imaging results that were available during my care of the patient were reviewed by me and considered in my medical decision making (see chart for details).  Clinical Course    54yF with symptoms very consistent with peripheral vertigo. Definitely positional. Reproducible. Fatiguable. Nonfocal neuro exam. Doubt central cause. Continue PRN meclizine. Given instructions for Epley maneuver. 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 Provided with work note as requested.   Final Clinical Impressions(s) / ED Diagnoses   Final diagnoses:  Vertigo    New Prescriptions Discharge Medication List as of 10/11/2015  2:26 PM    START taking these medications   Details  meclizine (ANTIVERT) 25 MG tablet Take 0.5-1 tablets (12.5-25 mg total) by mouth 3 (three) times daily as needed for dizziness., Starting Mon 10/11/2015, Print         Virgel Manifold, MD 10/11/15 1501

## 2018-09-03 ENCOUNTER — Encounter (HOSPITAL_COMMUNITY): Payer: Self-pay | Admitting: Emergency Medicine

## 2018-09-03 ENCOUNTER — Encounter (HOSPITAL_COMMUNITY)
Admission: EM | Disposition: A | Discharge status: Home / Self Care | Payer: Self-pay | Source: Home / Self Care | Attending: Emergency Medicine

## 2018-09-03 ENCOUNTER — Emergency Department (HOSPITAL_COMMUNITY): Payer: Self-pay | Admitting: Anesthesiology

## 2018-09-03 ENCOUNTER — Other Ambulatory Visit: Payer: Self-pay

## 2018-09-03 ENCOUNTER — Ambulatory Visit (HOSPITAL_COMMUNITY)
Admission: EM | Admit: 2018-09-03 | Discharge: 2018-09-03 | Disposition: A | Discharge status: Home / Self Care | Payer: Self-pay | Attending: Emergency Medicine | Admitting: Emergency Medicine

## 2018-09-03 DIAGNOSIS — I10 Essential (primary) hypertension: Secondary | ICD-10-CM | POA: Insufficient documentation

## 2018-09-03 DIAGNOSIS — X58XXXA Exposure to other specified factors, initial encounter: Secondary | ICD-10-CM | POA: Insufficient documentation

## 2018-09-03 DIAGNOSIS — E785 Hyperlipidemia, unspecified: Secondary | ICD-10-CM | POA: Insufficient documentation

## 2018-09-03 DIAGNOSIS — K5790 Diverticulosis of intestine, part unspecified, without perforation or abscess without bleeding: Secondary | ICD-10-CM | POA: Insufficient documentation

## 2018-09-03 DIAGNOSIS — K219 Gastro-esophageal reflux disease without esophagitis: Secondary | ICD-10-CM | POA: Insufficient documentation

## 2018-09-03 DIAGNOSIS — F410 Panic disorder [episodic paroxysmal anxiety] without agoraphobia: Secondary | ICD-10-CM | POA: Insufficient documentation

## 2018-09-03 DIAGNOSIS — K222 Esophageal obstruction: Secondary | ICD-10-CM

## 2018-09-03 DIAGNOSIS — F419 Anxiety disorder, unspecified: Secondary | ICD-10-CM | POA: Insufficient documentation

## 2018-09-03 DIAGNOSIS — Z885 Allergy status to narcotic agent status: Secondary | ICD-10-CM | POA: Insufficient documentation

## 2018-09-03 DIAGNOSIS — Z87891 Personal history of nicotine dependence: Secondary | ICD-10-CM | POA: Insufficient documentation

## 2018-09-03 DIAGNOSIS — Z1159 Encounter for screening for other viral diseases: Secondary | ICD-10-CM | POA: Insufficient documentation

## 2018-09-03 DIAGNOSIS — Z9049 Acquired absence of other specified parts of digestive tract: Secondary | ICD-10-CM | POA: Insufficient documentation

## 2018-09-03 DIAGNOSIS — Z8249 Family history of ischemic heart disease and other diseases of the circulatory system: Secondary | ICD-10-CM | POA: Insufficient documentation

## 2018-09-03 DIAGNOSIS — Z8371 Family history of colonic polyps: Secondary | ICD-10-CM | POA: Insufficient documentation

## 2018-09-03 DIAGNOSIS — T18128A Food in esophagus causing other injury, initial encounter: Secondary | ICD-10-CM

## 2018-09-03 DIAGNOSIS — Z800 Family history of malignant neoplasm of digestive organs: Secondary | ICD-10-CM | POA: Insufficient documentation

## 2018-09-03 DIAGNOSIS — Z79899 Other long term (current) drug therapy: Secondary | ICD-10-CM | POA: Insufficient documentation

## 2018-09-03 DIAGNOSIS — Z7982 Long term (current) use of aspirin: Secondary | ICD-10-CM | POA: Insufficient documentation

## 2018-09-03 HISTORY — PX: ESOPHAGOGASTRODUODENOSCOPY (EGD) WITH PROPOFOL: SHX5813

## 2018-09-03 HISTORY — PX: BALLOON DILATION: SHX5330

## 2018-09-03 HISTORY — PX: FOREIGN BODY REMOVAL: SHX962

## 2018-09-03 LAB — SARS CORONAVIRUS 2 BY RT PCR (HOSPITAL ORDER, PERFORMED IN ~~LOC~~ HOSPITAL LAB): SARS Coronavirus 2: NEGATIVE

## 2018-09-03 SURGERY — ESOPHAGOGASTRODUODENOSCOPY (EGD) WITH PROPOFOL
Anesthesia: General

## 2018-09-03 MED ORDER — NITROGLYCERIN 0.4 MG SL SUBL
0.4000 mg | SUBLINGUAL_TABLET | Freq: Once | SUBLINGUAL | Status: AC
  Administered 2018-09-03: 0.4 mg via SUBLINGUAL
  Filled 2018-09-03: qty 1

## 2018-09-03 MED ORDER — SUCCINYLCHOLINE CHLORIDE 200 MG/10ML IV SOSY
PREFILLED_SYRINGE | INTRAVENOUS | Status: DC | PRN
  Administered 2018-09-03: 120 mg via INTRAVENOUS

## 2018-09-03 MED ORDER — PROPOFOL 10 MG/ML IV BOLUS
INTRAVENOUS | Status: DC | PRN
  Administered 2018-09-03: 130 mg via INTRAVENOUS

## 2018-09-03 MED ORDER — LIDOCAINE 2% (20 MG/ML) 5 ML SYRINGE
INTRAMUSCULAR | Status: DC | PRN
  Administered 2018-09-03: 50 mg via INTRAVENOUS

## 2018-09-03 MED ORDER — DEXAMETHASONE SODIUM PHOSPHATE 10 MG/ML IJ SOLN
INTRAMUSCULAR | Status: DC | PRN
  Administered 2018-09-03: 4 mg via INTRAVENOUS

## 2018-09-03 MED ORDER — ONDANSETRON HCL 4 MG/2ML IJ SOLN
INTRAMUSCULAR | Status: DC | PRN
  Administered 2018-09-03: 4 mg via INTRAVENOUS

## 2018-09-03 MED ORDER — SODIUM CHLORIDE 0.9 % IV SOLN
INTRAVENOUS | Status: DC
  Administered 2018-09-03: 08:00:00 via INTRAVENOUS

## 2018-09-03 MED ORDER — LORAZEPAM 2 MG/ML IJ SOLN
1.0000 mg | Freq: Once | INTRAMUSCULAR | Status: AC
  Administered 2018-09-03: 1 mg via INTRAVENOUS
  Filled 2018-09-03: qty 1

## 2018-09-03 MED ORDER — STERILE WATER FOR INJECTION IJ SOLN
INTRAMUSCULAR | Status: AC
  Filled 2018-09-03: qty 10

## 2018-09-03 MED ORDER — GLUCAGON HCL RDNA (DIAGNOSTIC) 1 MG IJ SOLR
1.0000 mg | Freq: Once | INTRAMUSCULAR | Status: AC
  Administered 2018-09-03: 1 mg via INTRAVENOUS
  Filled 2018-09-03: qty 1

## 2018-09-03 MED ORDER — FENTANYL CITRATE (PF) 250 MCG/5ML IJ SOLN
INTRAMUSCULAR | Status: DC | PRN
  Administered 2018-09-03: 50 ug via INTRAVENOUS

## 2018-09-03 MED ORDER — OMEPRAZOLE 40 MG PO CPDR: 40.0000 mg | DELAYED_RELEASE_CAPSULE | Freq: Every day | ORAL | 3 refills | Status: AC

## 2018-09-03 SURGICAL SUPPLY — 15 items

## 2018-09-03 NOTE — Consult Note (Addendum)
Referring Provider: No ref. provider found Primary Care Physician:  Isa Rankin, NP Primary Gastroenterologist: No prior GI care  Reason for Consultation:  Food impaction   IMPRESSION:  Suspected esophageal food impaction Longstanding history of GERD controlled on daily proton pump inhibitor therapy Cholecystectomy for cholelithiasis 2015 Colonoscopy in [facility] in 2010 revealing diverticulosis Family history of colon polyps (brother)  Suspected food impaction not responding to multiple doses of glucagon, lorazepam, or nitroglycerin.  The patient remains unable to tolerate her own secretions.  PLAN: Upper endoscopy with removal of food impaction when her COVID-19 test results are available  I consented the patient at the bedside in the ER discussing the risks, benefits, and alternatives to endoscopic evaluation. In particular, we discussed the risks that include, but are not limited to, reaction to medication, cardiopulmonary compromise, bleeding requiring blood transfusion, aspiration resulting in pneumonia, perforation requiring surgery, lack of diagnosis, severe illness requiring hospitalization, and even death. We reviewed the risk of missed lesion including polyps or even cancer. The patient acknowledges these risks and asks that we proceed.   HPI: Tiffany Barker is a 58 y.o. 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  The history is obtained through the patient and review of her electronic health record.  She ate 2 pieces of chicken at around 6 PM yesterday and has been unable to tolerate her secretions since that time.    She has a history of hypertension and hyperlipidemia, and reflux.  She has a longstanding history of reflux.  She was on ranitidine for longer than she can remember.  Has more recently been on omeprazole 20 mg nightly with control of her symptoms.   She reports a 6-year history of intermittent dysphasia to solids.  In particular she has trouble with lettuce, cornbread, and greasy or fried foods.  Symptoms occur every few months with the last episode occurring 4 months ago.  Associated choking and coughing.  She will feel the food temporarily hang in the region of the sternal notch but ultimately passes with relaxation and a water bolus.  Occasionally feels like it is "blocking her lungs."  On one prior episode she was evaluated in the EGD and treated with an elixir that resolved her symptoms.   There is no prior history of upper endoscopy.  Evaluation of the symptoms in 2015 revealed a cholelithiasis.  She had a laparoscopic cholecystectomy but has noted no change in her symptoms since that time.  Yesterday she ate 2 pieces of chicken around 6 PM and has been unable to tolerate her secretions since that time.  Earlier in the day she had had 2 bites of a chocolate candy bar.  She was busy at work and unable to find time to eat more.  She was treated with glucagon in the [facility] last night without improvement.  On arrival at the [facility], ED she was treated with additional glucagon, Ativan, and nitroglycerin with without improvement. No odynophagia, dysphonia, sore throat, neck pain, anemia, change in bowel habits, blood in the stool, weight loss, or anorexia. No other associated symptoms. No identified exacerbating or relieving features.   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  She quit smoking in 1984.  She denies the use of any street  drugs at this time including marijuana.  She uses ibuprofen for joint pains.  No recent use.  The patient had a colonoscopy in [place] approximately 10 years ago for abdominal pains.  The colonoscopy revealed diverticulosis.  There is no prior history of upper endoscopy.   Her  brother has a history of colon polyps and symptomatic diverticular disease.  Her mother had a small bowel perforation requiring surgery.  A paternal uncle has stomach cancer.  There is no known family history of colon cancer.  No known family history of pancreatic cancer.  No known family history of endometrial or uterine cancer.    Past Medical History:  Diagnosis Date  . Cholelithiasis   . Diverticulosis    Colonoscopy 2010 - [place]  . Essential hypertension, benign   . GERD (gastroesophageal reflux disease)   . Panic attacks     Past Surgical History:  Procedure Laterality Date  . BACK SURGERY    . CARDIAC CATHETERIZATION  2011  . Chest cyst removal    . CHOLECYSTECTOMY N/A 10/31/2013   Procedure: LAPAROSCOPIC CHOLECYSTECTOMY;  Surgeon: Jamesetta So, MD;  Location: [facility];  Service: General;  Laterality: N/A;  . TUBAL LIGATION      Prior to Admission medications   Medication Sig Start Date End Date Taking? Authorizing Provider  aspirin EC 81 MG tablet Take 81 mg by mouth daily.   Yes [provider]  cyclobenzaprine (FLEXERIL) 10 MG tablet Take 10 mg by mouth 3 (three) times daily as needed for muscle spasms.   Yes [provider]  diphenhydrAMINE (BENADRYL) 25 mg capsule Take 25 mg by mouth at bedtime.   Yes [provider]  furosemide (LASIX) 20 MG tablet Take 20 mg by mouth daily.    Yes [provider]  metoprolol tartrate (LOPRESSOR) 25 MG tablet Take 75 mg by mouth 2 (two) times daily.    Yes [provider]  omeprazole (PRILOSEC) 20 MG capsule Take 20 mg by mouth daily.   Yes [provider]  meclizine (ANTIVERT) 25 MG tablet Take 0.5-1 tablets (12.5-25 mg total) by mouth 3 (three) times daily as needed for dizziness. Patient not taking: Reported on 09/03/2018 10/11/15   Virgel Manifold, MD    Current Facility-Administered Medications  Medication Dose Route Frequency Provider Last Rate Last Dose  . sterile  water (preservative free) injection            Current Outpatient Medications  Medication Sig Dispense Refill  . aspirin EC 81 MG tablet Take 81 mg by mouth daily.    . cyclobenzaprine (FLEXERIL) 10 MG tablet Take 10 mg by mouth 3 (three) times daily as needed for muscle spasms.    . diphenhydrAMINE (BENADRYL) 25 mg capsule Take 25 mg by mouth at bedtime.    . furosemide (LASIX) 20 MG tablet Take 20 mg by mouth daily.     . metoprolol tartrate (LOPRESSOR) 25 MG tablet Take 75 mg by mouth 2 (two) times daily.     Marland Kitchen omeprazole (PRILOSEC) 20 MG capsule Take 20 mg by mouth daily.    . meclizine (ANTIVERT) 25 MG tablet Take 0.5-1 tablets (12.5-25 mg total) by mouth 3 (three) times daily as needed for dizziness. (Patient not taking: Reported on 09/03/2018) 20 tablet 0    Allergies as of 09/03/2018 - Review Complete 09/03/2018  Allergen Reaction Noted  . Wasp venom Anaphylaxis 10/21/2010  . Percocet [oxycodone-acetaminophen] Itching and Nausea And Vomiting 10/21/2010  Family History  Problem Relation Age of Onset  . Coronary artery disease Mother        CAD in her 27's  . Coronary artery disease Father        CAD in his 48's  . Stroke Father     Social History   Socioeconomic History  . Marital status: Divorced    Spouse name: Not on file  . Number of children: Not on file  . Years of education: Not on file  . Highest education level: Not on file  Occupational History  . Occupation: Electrical engineer    Comment: Self employed  Social Needs  . Financial resource strain: Not on file  . Food insecurity    Worry: Not on file    Inability: Not on file  . Transportation needs    Medical: Not on file    Non-medical: Not on file  Tobacco Use  . Smoking status: Former Smoker    Packs/day: 0.50    Years: 2.50    Pack years: 1.25    Types: Cigarettes    Quit date: 02/21/1980    Years since quitting: 38.5  . Smokeless tobacco: Never Used  Substance and Sexual Activity  . Alcohol use:  Yes    Comment: Occasionaly/Once or twice a week (Beer)  . Drug use: No  . Sexual activity: Yes    Birth control/protection: Surgical  Lifestyle  . Physical activity    Days per week: Not on file    Minutes per session: Not on file  . Stress: Not on file  Relationships  . Social Herbalist on phone: Not on file    Gets together: Not on file    Attends religious service: Not on file    Active member of club or organization: Not on file    Attends meetings of clubs or organizations: Not on file    Relationship status: Not on file  . Intimate partner violence    Fear of current or ex partner: Not on file    Emotionally abused: Not on file    Physically abused: Not on file    Forced sexual activity: Not on file  Other Topics Concern  . Not on file  Social History Narrative  . Not on file    Review of Systems: 12 system ROS is negative except as noted above.   Physical Exam: Temp:  [98.1 F (36.7 C)] 98.1 F (36.7 C) (07/14 0330) Pulse Rate:  [100] 100 (07/14 0330) Resp:  [16] 16 (07/14 0330) BP: (113)/(97) 113/97 (07/14 0330) SpO2:  [98 %] 98 % (07/14 0330)   General:   Alert,  well-nourished, pleasant and cooperative in NAD. She appears tired. Sitting with a spitoon. Not tolerating her secretions with frequent spitting and vomiting.  Head:  Normocephalic and atraumatic. Eyes:  Sclera clear, no icterus.   Conjunctiva pink. Ears:  Normal auditory acuity. Nose:  No deformity, discharge,  or lesions. Mouth:  No deformity or lesions.   Neck:  Supple; no masses or thyromegaly. Lungs:  Clear throughout to auscultation.   No wheezes. Heart:  Regular rate and rhythm; no murmurs. Abdomen:  Central obesity, soft,nontender, nondistended, normal bowel sounds, no rebound or guarding. No hepatosplenomegaly.  Spleen tip is not palpable.   Rectal:  Deferred  Msk:  Symmetrical. No boney deformities LAD: No inguinal or umbilical LAD Extremities:  No clubbing or edema.  Neurologic:  Alert and  oriented x4;  grossly nonfocal Skin:  Intact without significant lesions or rashes. No palmar erythema or spider angioma.  Psych:  Alert and cooperative. Normal mood and affect.    Tiffany Barker L. Tarri Glenn, MD, MPH 09/03/2018, 5:31 AM

## 2018-09-03 NOTE — Discharge Instructions (Addendum)
I removed the chicken lodged in the esophagus and dilated the stricture or narrow portion of the esophagus. You should swallow better but need to ease back into eating as outlined below (see Diet).  We will arrange for you to have a follow-up in our office with Dr. Tarri Glenn to make sure you are ok and consider a screening colonoscopy.  I am changing omeprazole to 40 mg daily to reduce chance of having this problem again  I appreciate the opportunity to care for you.  Gatha Mayer, MD, FACG   YOU HAD AN ENDOSCOPIC PROCEDURE TODAY: Refer to the procedure report and other information in the discharge instructions given to you for any specific questions about what was found during the examination. If this information does not answer your questions, please call Dr. Celesta Aver office at [phone] to clarify.   YOU SHOULD EXPECT: Some feelings of bloating in the abdomen. Passage of more gas than usual. Walking can help get rid of the air that was put into your GI tract during the procedure and reduce the bloating. If you had a lower endoscopy (such as a colonoscopy or flexible sigmoidoscopy) you may notice spotting of blood in your stool or on the toilet paper. Some abdominal soreness may be present for a day or two, also.  DIET:   CLEAR LIQUIDS ONLY UNTIL NOON SOFT FOODS AFTER THAT TRY NORMAL FOODS TOMORROW   ACTIVITY: Your care partner should take you home directly after the procedure. You should plan to take it easy, moving slowly for the rest of the day. You can resume normal activity the day after the procedure however YOU SHOULD NOT DRIVE, use power tools, machinery or perform tasks that involve climbing or major physical exertion for 24 hours (because of the sedation medicines used during the test).   SYMPTOMS TO REPORT IMMEDIATELY: A gastroenterologist can be reached at any hour. Please call [phone]  for any of the following symptoms:   Following upper endoscopy (EGD,  EUS, ERCP, esophageal dilation) Vomiting of blood or coffee ground material  New, significant abdominal pain  New, significant chest pain or pain under the shoulder blades  Painful or persistently difficult swallowing  New shortness of breath  Black, tarry-looking or red, bloody stools    Esophageal Stricture  Esophageal stricture is a narrowing (stricture) of the esophagus. The esophagus is the part of the body that moves food and liquid from your mouth to your stomach. The esophagus can become narrow because of disease or damage to the area. This condition can make swallowing difficult, painful, or even impossible. It also makes choking more likely. What are the causes? The most common cause of this condition is gastroesophageal reflux disease (GERD). Normally, food travels down the esophagus and stays in the stomach to be digested. In GERD, food and stomach acid move back up into the esophagus. Over time, this causes scar tissue and leads to narrowing. Other causes of esophageal stricture include:  Scarring from swallowing (ingesting) a harmful substance.  Damage from medical instruments used in the esophagus.  Radiation therapy.  Cancer.  Inflammation of the esophagus. What increases the risk? You are more likely to develop an esophageal stricture if you have GERD or esophageal cancer. What are the signs or symptoms? Symptoms of this condition include:  Difficulty swallowing.  Pain when swallowing.  Burning pain or discomfort in the throat or chest (heartburn).  Vomiting or spitting up (regurgitating) food or liquids.  Unexplained weight loss. How is  this diagnosed? This condition may be diagnosed based on:  Your symptoms and a physical exam.  Tests, such as: ? Upper endoscopy. Your health care provider will insert a flexible tube with a tiny camera on it (endoscope) into your esophagus to check for a stricture. A tissue sample (biopsy) may also be taken to be  examined under a microscope. ? Esophageal pH monitoring. This test involves using a tube to collect acid in the esophagus to determine how much stomach acid is entering the esophagus. ? Barium swallow test. For this test, you will drink a chalky liquid (barium solution) that coats the lining of the esophagus. Then you will have an X-ray taken. The barium solution helps to show if there is a stricture. How is this treated? Treatment for esophageal stricture depends on what is causing your condition and how severe your condition is. Treatment options include:  Esophageal dilation. In this procedure, a health care provider inserts an endoscope or a tool called a dilator into the esophagus to gently stretch it and make the opening wider.  Stents. In some cases, a health care provider may place a small device (stent) in the esophagus to keep it open.  Acid-blocking medicines. Taking these can help you manage GERD symptoms after an esophageal stricture. Controlling your GERD symptoms or being free of them can prevent the stricture from returning. Follow these instructions at home: Eating and drinking      Follow instructions from your health care provider about any diet changes.  Cut your food into small pieces, chew well, and eat slowly  Try to eat soft food that is easier to swallow.  Eat and drink only when you are sitting upright.  Do not drink alcohol. If you need help quitting, ask your health care provider.  Do not eat during the 3 hours before bedtime.  Do not overeat at meals.  Do not eat foods that can make reflux worse. These include: ? Fatty foods, such as red meat and processed foods. ? Spicy foods. ? Soda. ? Tomato products. ? Chocolate. General instructions  Take over-the-counter and prescription medicines only as told by your health care provider.  Do not use any products that contain nicotine or tobacco, such as cigarettes and e-cigarettes. If you need help  quitting, ask your health care provider.  Lose weight if you are overweight.  Wear loose, comfortable clothing.  When lying in bed, raise (elevate) your head with pillows. This will help to prevent your stomach contents from backing up into your esophagus while you sleep.  Keep all follow-up visits as told by your health care provider. This is important. Contact a health care provider if:  You have problems eating or swallowing.  You regurgitate food and liquid.  Your symptoms do not improve with treatment. Get help right away if:  You can no longer keep down any food, drinks, or your saliva. Summary  Esophageal stricture is a narrowing of the part of the body that moves food and liquid from your mouth to your stomach (esophagus).  The esophagus can become narrow because of disease or damage to the area. This can make swallowing difficult, painful, or even impossible.  Treatment for esophageal stricture depends on what is causing your condition and how severe your condition is. In some cases, procedures may be done to make the opening of the esophagus wider or to place a stent in the esophagus to keep it open.  Do not drink alcohol, overeat at meals, or  eat foods that can make reflux worse. This information is not intended to replace advice given to you by your health care provider. Make sure you discuss any questions you have with your health care provider. Document Released: 10/17/2005 Document Revised: 05/04/2017 Document Reviewed: 10/13/2016 Elsevier Patient Education  2020 [employer].

## 2018-09-03 NOTE — H&P (View-Only) (Signed)
Referring Provider: No ref. provider found Primary Care Physician:  Isa Rankin, NP Primary Gastroenterologist: No prior GI care  Reason for Consultation:  Food impaction   IMPRESSION:  Suspected esophageal food impaction Longstanding history of GERD controlled on daily proton pump inhibitor therapy Cholecystectomy for cholelithiasis 2015 Colonoscopy in [facility] in 2010 revealing diverticulosis Family history of colon polyps (brother)  Suspected food impaction not responding to multiple doses of glucagon, lorazepam, or nitroglycerin.  The patient remains unable to tolerate her own secretions.  PLAN: Upper endoscopy with removal of food impaction when her COVID-19 test results are available  I consented the patient at the bedside in the ER discussing the risks, benefits, and alternatives to endoscopic evaluation. In particular, we discussed the risks that include, but are not limited to, reaction to medication, cardiopulmonary compromise, bleeding requiring blood transfusion, aspiration resulting in pneumonia, perforation requiring surgery, lack of diagnosis, severe illness requiring hospitalization, and even death. We reviewed the risk of missed lesion including polyps or even cancer. The patient acknowledges these risks and asks that we proceed.   HPI: Tiffany Barker is a 58 y.o. 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  The history is obtained through the patient and review of her electronic health record.  She ate 2 pieces of chicken at around 6 PM yesterday and has been unable to tolerate her secretions since that time.    She has a history of hypertension and hyperlipidemia, and reflux.  She has a longstanding history of reflux.  She was on ranitidine for longer than she can remember.  Has more recently been on omeprazole 20 mg nightly with control of her symptoms.   She reports a 6-year history of intermittent dysphasia to solids.  In particular she has trouble with lettuce, cornbread, and greasy or fried foods.  Symptoms occur every few months with the last episode occurring 4 months ago.  Associated choking and coughing.  She will feel the food temporarily hang in the region of the sternal notch but ultimately passes with relaxation and a water bolus.  Occasionally feels like it is "blocking her lungs."  On one prior episode she was evaluated in the EGD and treated with an elixir that resolved her symptoms.   There is no prior history of upper endoscopy.  Evaluation of the symptoms in 2015 revealed a cholelithiasis.  She had a laparoscopic cholecystectomy but has noted no change in her symptoms since that time.  Yesterday she ate 2 pieces of chicken around 6 PM and has been unable to tolerate her secretions since that time.  Earlier in the day she had had 2 bites of a chocolate candy bar.  She was busy at work and unable to find time to eat more.  She was treated with glucagon in the [facility] ER last night without improvement.  On arrival at the [facility], ED she was treated with additional glucagon, Ativan, and nitroglycerin with without improvement. No odynophagia, dysphonia, sore throat, neck pain, anemia, change in bowel habits, blood in the stool, weight loss, or anorexia. No other associated symptoms. No identified exacerbating or relieving features.   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  She quit smoking in 1984.  She denies the use of any street  drugs at this time including marijuana.  She uses ibuprofen for joint pains.  No recent use.  The patient had a colonoscopy in [place] approximately 10 years ago for abdominal pains.  The colonoscopy revealed diverticulosis.  There is no prior history of upper endoscopy.   Her  brother has a history of colon polyps and symptomatic diverticular disease.  Her mother had a small bowel perforation requiring surgery.  A paternal uncle has stomach cancer.  There is no known family history of colon cancer.  No known family history of pancreatic cancer.  No known family history of endometrial or uterine cancer.    Past Medical History:  Diagnosis Date  . Cholelithiasis   . Diverticulosis    Colonoscopy 2010 - [facility]  . Essential hypertension, benign   . GERD (gastroesophageal reflux disease)   . Panic attacks     Past Surgical History:  Procedure Laterality Date  . BACK SURGERY    . CARDIAC CATHETERIZATION  2011  . Chest cyst removal    . CHOLECYSTECTOMY N/A 10/31/2013   Procedure: LAPAROSCOPIC CHOLECYSTECTOMY;  Surgeon: Jamesetta So, MD;  Location: [facility];  Service: General;  Laterality: N/A;  . TUBAL LIGATION      Prior to Admission medications   Medication Sig Start Date End Date Taking? Authorizing Provider  aspirin EC 81 MG tablet Take 81 mg by mouth daily.   Yes [provider]  cyclobenzaprine (FLEXERIL) 10 MG tablet Take 10 mg by mouth 3 (three) times daily as needed for muscle spasms.   Yes [provider]  diphenhydrAMINE (BENADRYL) 25 mg capsule Take 25 mg by mouth at bedtime.   Yes [provider]  furosemide (LASIX) 20 MG tablet Take 20 mg by mouth daily.    Yes [provider]  metoprolol tartrate (LOPRESSOR) 25 MG tablet Take 75 mg by mouth 2 (two) times daily.    Yes [provider]  omeprazole (PRILOSEC) 20 MG capsule Take 20 mg by mouth daily.   Yes [provider]  meclizine (ANTIVERT) 25 MG tablet Take 0.5-1 tablets (12.5-25 mg total) by mouth 3 (three) times daily as needed for dizziness. Patient not taking: Reported on 09/03/2018 10/11/15   Virgel Manifold, MD    Current Facility-Administered Medications  Medication Dose Route Frequency Provider Last Rate Last Dose  . sterile  water (preservative free) injection            Current Outpatient Medications  Medication Sig Dispense Refill  . aspirin EC 81 MG tablet Take 81 mg by mouth daily.    . cyclobenzaprine (FLEXERIL) 10 MG tablet Take 10 mg by mouth 3 (three) times daily as needed for muscle spasms.    . diphenhydrAMINE (BENADRYL) 25 mg capsule Take 25 mg by mouth at bedtime.    . furosemide (LASIX) 20 MG tablet Take 20 mg by mouth daily.     . metoprolol tartrate (LOPRESSOR) 25 MG tablet Take 75 mg by mouth 2 (two) times daily.     Marland Kitchen omeprazole (PRILOSEC) 20 MG capsule Take 20 mg by mouth daily.    . meclizine (ANTIVERT) 25 MG tablet Take 0.5-1 tablets (12.5-25 mg total) by mouth 3 (three) times daily as needed for dizziness. (Patient not taking: Reported on 09/03/2018) 20 tablet 0    Allergies as of 09/03/2018 - Review Complete 09/03/2018  Allergen Reaction Noted  . Wasp venom Anaphylaxis 10/21/2010  . Percocet [oxycodone-acetaminophen] Itching and Nausea And Vomiting 10/21/2010  Family History  Problem Relation Age of Onset  . Coronary artery disease Mother        CAD in her 60's  . Coronary artery disease Father        CAD in his 80's  . Stroke Father     Social History   Socioeconomic History  . Marital status: Divorced    Spouse name: Not on file  . Number of children: Not on file  . Years of education: Not on file  . Highest education level: Not on file  Occupational History  . Occupation: Electrical engineer    Comment: Self employed  Social Needs  . Financial resource strain: Not on file  . Food insecurity    Worry: Not on file    Inability: Not on file  . Transportation needs    Medical: Not on file    Non-medical: Not on file  Tobacco Use  . Smoking status: Former Smoker    Packs/day: 0.50    Years: 2.50    Pack years: 1.25    Types: Cigarettes    Quit date: 02/21/1980    Years since quitting: 38.5  . Smokeless tobacco: Never Used  Substance and Sexual Activity  . Alcohol use:  Yes    Comment: Occasionaly/Once or twice a week (Beer)  . Drug use: No  . Sexual activity: Yes    Birth control/protection: Surgical  Lifestyle  . Physical activity    Days per week: Not on file    Minutes per session: Not on file  . Stress: Not on file  Relationships  . Social Herbalist on phone: Not on file    Gets together: Not on file    Attends religious service: Not on file    Active member of club or organization: Not on file    Attends meetings of clubs or organizations: Not on file    Relationship status: Not on file  . Intimate partner violence    Fear of current or ex partner: Not on file    Emotionally abused: Not on file    Physically abused: Not on file    Forced sexual activity: Not on file  Other Topics Concern  . Not on file  Social History Narrative  . Not on file    Review of Systems: 12 system ROS is negative except as noted above.   Physical Exam: Temp:  [98.1 F (36.7 C)] 98.1 F (36.7 C) (07/14 0330) Pulse Rate:  [100] 100 (07/14 0330) Resp:  [16] 16 (07/14 0330) BP: (113)/(97) 113/97 (07/14 0330) SpO2:  [98 %] 98 % (07/14 0330)   General:   Alert,  well-nourished, pleasant and cooperative in NAD. She appears tired. Sitting with a spitoon. Not tolerating her secretions with frequent spitting and vomiting.  Head:  Normocephalic and atraumatic. Eyes:  Sclera clear, no icterus.   Conjunctiva pink. Ears:  Normal auditory acuity. Nose:  No deformity, discharge,  or lesions. Mouth:  No deformity or lesions.   Neck:  Supple; no masses or thyromegaly. Lungs:  Clear throughout to auscultation.   No wheezes. Heart:  Regular rate and rhythm; no murmurs. Abdomen:  Central obesity, soft,nontender, nondistended, normal bowel sounds, no rebound or guarding. No hepatosplenomegaly.  Spleen tip is not palpable.   Rectal:  Deferred  Msk:  Symmetrical. No boney deformities LAD: No inguinal or umbilical LAD Extremities:  No clubbing or edema.  Neurologic:  Alert and  oriented x4;  grossly nonfocal Skin:  Intact without significant lesions or rashes. No palmar erythema or spider angioma.  Psych:  Alert and cooperative. Normal mood and affect.    Katura Eatherly L. Tarri Glenn, MD, MPH 09/03/2018, 5:31 AM

## 2018-09-03 NOTE — ED Provider Notes (Signed)
Emergency Department Provider Note   I have reviewed the triage vital signs and the nursing notes.   HISTORY  Chief Complaint Food Bolus   HPI Tiffany Barker is a 58 y.o. female presents the emergency department as a transfer from outside facility secondary to concern for esophageal impaction.  Patient states she is had 6 years or so of progressively worsening dysphasia.  She usually chews her food very well and concentrate on eating however today she had put a piece of chicken in her mouth that was cut up but then she dropped the knife and swallow the chicken without thinking and has not been able to tolerate secretions since that time.  She has multiple episodes of dry heaving.  She went to [facility] in [place] where they tried glucagon and coke twice which did not seem to help so they discussed with our gastroenterologist here for transfer and patient presents with same complaint not improving.  Patient states this happened around 1800.   No other associated or modifying symptoms.    Past Medical History:  Diagnosis Date  . Cholelithiasis   . Diverticulosis    Colonoscopy 2010 - [facility]  . Essential hypertension, benign   . GERD (gastroesophageal reflux disease)   . Panic attacks     Patient Active Problem List   Diagnosis Date Noted  . Mixed hyperlipidemia 12/22/2010  . Essential hypertension, benign 12/22/2010  . Chest pain 10/21/2010    Past Surgical History:  Procedure Laterality Date  . BACK SURGERY    . CARDIAC CATHETERIZATION  2011  . Chest cyst removal    . CHOLECYSTECTOMY N/A 10/31/2013   Procedure: LAPAROSCOPIC CHOLECYSTECTOMY;  Surgeon: Jamesetta So, MD;  Location: [facility];  Service: General;  Laterality: N/A;  . TUBAL LIGATION      Current Outpatient Rx  . Order #: [id number] Class: Historical Med  . Order #: [id number] Class: Historical Med  . Order #: [id number] Class: Print  . Order #: [id number] Class: Historical Med  . Order #: [id number] Class:  Historical Med  . Order #: [id number] Class: Historical Med  . Order #: [id number] Class: Historical Med  . Order #: [id number] Class: Historical Med    Allergies Wasp venom and Percocet [oxycodone-acetaminophen]  Family History  Problem Relation Age of Onset  . Coronary artery disease Mother        CAD in her 55's  . Coronary artery disease Father        CAD in his 77's  . Stroke Father     Social History Social History   Tobacco Use  . Smoking status: Former Smoker    Packs/day: 0.50    Years: 2.50    Pack years: 1.25    Types: Cigarettes    Quit date: 02/21/1980    Years since quitting: 38.5  . Smokeless tobacco: Never Used  Substance Use Topics  . Alcohol use: Yes    Comment: Occasionaly/Once or twice a week (Beer)  . Drug use: No    Review of Systems  All other systems negative except as documented in the HPI. All pertinent positives and negatives as reviewed in the HPI. ____________________________________________   PHYSICAL EXAM:  VITAL SIGNS: ED Triage Vitals [09/03/18 0330]  Enc Vitals Group     BP (!) 113/97     Pulse Rate 100     Resp 16     Temp 98.1 F (36.7 C)     Temp Source Oral     SpO2 98 %  Constitutional: Alert and oriented. Well appearing and in no acute distress. Eyes: Conjunctivae are normal. PERRL. EOMI. Head: Atraumatic. Nose: No congestion/rhinnorhea. Mouth/Throat: Mucous membranes are moist.  Oropharynx non-erythematous. Neck: No stridor.  No meningeal signs.   Cardiovascular: Normal rate, regular rhythm. Good peripheral circulation. Grossly normal heart sounds.   Respiratory: Normal respiratory effort.  No retractions. Lungs CTAB. Gastrointestinal: Soft and nontender. No distention.  Musculoskeletal: No lower extremity tenderness nor edema. No gross deformities of extremities. Neurologic:  Normal speech and language. No gross focal neurologic deficits are appreciated.  Skin:  Skin is warm, dry and intact. No rash noted.    ____________________________________________   INITIAL IMPRESSION / ASSESSMENT AND PLAN / ED COURSE  Attempted meds without success. Will call GI.  Discussed with Dr. Tarri Glenn who will scope when patient covid negative.    Pertinent labs & imaging results that were available during my care of the patient were reviewed by me and considered in my medical decision making (see chart for details).  ____________________________________________  FINAL CLINICAL IMPRESSION(S) / ED DIAGNOSES  Final diagnoses:  Esophageal obstruction due to food impaction     MEDICATIONS GIVEN DURING THIS VISIT:  Medications  sterile water (preservative free) injection (has no administration in time range)  LORazepam (ATIVAN) injection 1 mg (1 mg Intravenous Given 09/03/18 0339)  nitroGLYCERIN (NITROSTAT) SL tablet 0.4 mg (0.4 mg Sublingual Given 09/03/18 0339)  glucagon (human recombinant) (GLUCAGEN) injection 1 mg (1 mg Intravenous Given 09/03/18 0339)     NEW OUTPATIENT MEDICATIONS STARTED DURING THIS VISIT:  New Prescriptions   No medications on file    Note:  This note was prepared with assistance of Dragon voice recognition software. Occasional wrong-word or sound-a-like substitutions may have occurred due to the inherent limitations of voice recognition software.   Omaree Fuqua, Corene Cornea, MD 09/03/18 [phone]

## 2018-09-03 NOTE — Anesthesia Postprocedure Evaluation (Signed)
Anesthesia Post Note  Patient: MELANE WINDHOLZ  Procedure(s) Performed: ESOPHAGOGASTRODUODENOSCOPY (EGD) WITH PROPOFOL and removal of food impaction (N/A ) FOREIGN BODY REMOVAL BALLOON DILATION (N/A )     Patient location during evaluation: PACU Anesthesia Type: General Level of consciousness: awake and alert Pain management: pain level controlled Vital Signs Assessment: post-procedure vital signs reviewed and stable Respiratory status: spontaneous breathing, nonlabored ventilation, respiratory function stable and patient connected to nasal cannula oxygen Cardiovascular status: blood pressure returned to baseline and stable Postop Assessment: no apparent nausea or vomiting Anesthetic complications: no    Last Vitals:  Vitals:   09/03/18 1100 09/03/18 1130  BP: 133/80 133/78  Pulse: 91 90  Resp: 16 15  Temp:    SpO2: 96% 98%    Last Pain:  Vitals:   09/03/18 1032  TempSrc: Temporal  PainSc: 0-No pain                 Effie Berkshire

## 2018-09-03 NOTE — Op Note (Addendum)
[facility] Patient Name: Tiffany Barker Procedure Date : 09/03/2018 MRN: [id number] Attending MD: Gatha Mayer , MD Date of Birth: October 23, 1960 CSN: [id number] Age: 58 Admit Type: Inpatient Procedure:                Upper GI endoscopy Indications:              Dysphagia, Foreign body in the esophagus Providers:                Gatha Mayer, MD, Carlyn Reichert, RN, Cherylynn Ridges, Technician, Merrilyn Puma, CRNA Referring MD:              Medicines:                General Anesthesia Complications:            No immediate complications. Estimated Blood Loss:     Estimated blood loss was minimal. Procedure:                Pre-Anesthesia Assessment:                           - Prior to the procedure, a History and Physical                            was performed, and patient medications and                            allergies were reviewed. The patient's tolerance of                            previous anesthesia was also reviewed. The risks                            and benefits of the procedure and the sedation                            options and risks were discussed with the patient.                            All questions were answered, and informed consent                            was obtained. Prior Anticoagulants: The patient has                            taken no previous anticoagulant or antiplatelet                            agents. ASA Grade Assessment: II - A patient with                            mild systemic disease. After reviewing the risks  and benefits, the patient was deemed in                            satisfactory condition to undergo the procedure.                           After obtaining informed consent, the endoscope was                            passed under direct vision. Throughout the                            procedure, the patient's blood pressure, pulse, and             oxygen saturations were monitored continuously. The                            GIF-H190 ([id number]) Olympus gastroscope was                            introduced through the mouth, and advanced to the                            second part of duodenum. The upper GI endoscopy was                            accomplished without difficulty. The patient                            tolerated the procedure well. Scope In: Scope Out: Findings:      Food was found in the distal esophagus. Removal of food was       accomplished. Chopped with snare and advanced into stomach with scope       pressure. Estimated blood loss: none. Minimal mucosal change so dilation       performed      One benign-appearing, intrinsic moderate stenosis was found in the       distal esophagus. This stenosis measured 1.5 cm (inner diameter). The       stenosis was traversed. A TTS dilator was passed through the scope.       Dilation with a 15-16.5-18 mm balloon dilator was performed to 18 mm.       The dilation site was examined and showed mild mucosal disruption.       Estimated blood loss was minimal.      The exam was otherwise without abnormality.      The cardia and gastric fundus were normal on retroflexion. Impression:               - Food in the distal esophagus. Removal was                            successful.                           - Benign-appearing esophageal stenosis. Dilated.                           -  The examination was otherwise normal. Recommendation:           - Patient has a contact number available for                            emergencies. The signs and symptoms of potential                            delayed complications were discussed with the                            patient. Return to normal activities tomorrow.                            Written discharge instructions were provided to the                            patient.                           - Clear liquids x 1 hour  then soft foods rest of                            day. Start prior diet tomorrow.                           - Continue present medications.                           - INCREASE OMEPRAZOLE TO 40 MG QD TO REDUCE                            STRICTURE RECURRENCE                           F/U DR Tarri Glenn 8/11 FOR SX REASSESSMENT AND                            CONSIDER SCREENING COLONOSCOPY Procedure Code(s):        --- Professional ---                           [phone], Esophagogastroduodenoscopy, flexible,                            transoral; with removal of foreign body(s)                           43249, Esophagogastroduodenoscopy, flexible,                            transoral; with transendoscopic balloon dilation of                            esophagus (less than 30 mm diameter) Diagnosis Code(s):        --- Professional ---  V61.537H, Food in esophagus causing other injury,                            initial encounter                           K22.2, Esophageal obstruction                           R13.10, Dysphagia, unspecified                           T18.108A, Unspecified foreign body in esophagus                            causing other injury, initial encounter CPT copyright 2019 [employer]. All rights reserved. The codes documented in this report are preliminary and upon coder review may  be revised to meet current compliance requirements. Gatha Mayer, MD 09/03/2018 10:42:43 AM This report has been signed electronically. Number of Addenda: 0

## 2018-09-03 NOTE — ED Triage Notes (Addendum)
Transfer from [facility] with c/o of food bolus. Pt states this has happened multiple times before but never this bad. Received glucagon and coke X 2, other methods attempted as well. Sent for further eval and tx.

## 2018-09-03 NOTE — Anesthesia Preprocedure Evaluation (Addendum)
Anesthesia Evaluation  Patient identified by MRN, date of birth, ID band Patient awake    Reviewed: Allergy & Precautions, NPO status , Patient's Chart, lab work & pertinent test results  Airway Mallampati: II  TM Distance: >3 FB Neck ROM: Full    Dental  (+) Teeth Intact, Dental Advisory Given   Pulmonary former smoker,    breath sounds clear to auscultation       Cardiovascular hypertension, Pt. on home beta blockers and Pt. on medications  Rhythm:Regular Rate:Normal     Neuro/Psych Anxiety    GI/Hepatic Neg liver ROS, GERD  Medicated,  Endo/Other  negative endocrine ROS  Renal/GU negative Renal ROS     Musculoskeletal negative musculoskeletal ROS (+)   Abdominal Normal abdominal exam  (+)   Peds  Hematology negative hematology ROS (+)   Anesthesia Other Findings   Reproductive/Obstetrics                            Anesthesia Physical Anesthesia Plan  ASA: II  Anesthesia Plan: General   Post-op Pain Management:    Induction: Intravenous, Rapid sequence and Cricoid pressure planned  PONV Risk Score and Plan: 0 and Propofol infusion  Airway Management Planned: Oral ETT  Additional Equipment: None  Intra-op Plan:   Post-operative Plan: Extubation in OR  Informed Consent: I have reviewed the patients History and Physical, chart, labs and discussed the procedure including the risks, benefits and alternatives for the proposed anesthesia with the patient or authorized representative who has indicated his/her understanding and acceptance.     Dental advisory given  Plan Discussed with: CRNA  Anesthesia Plan Comments:       Anesthesia Quick Evaluation

## 2018-09-03 NOTE — Interval H&P Note (Signed)
History and Physical Interval Note:  09/03/2018 10:08 AM  Tiffany Barker  has presented today for surgery, with the diagnosis of esophageal food impaction.  The various methods of treatment have been discussed with the patient and family. After consideration of risks, benefits and other options for treatment, the patient has consented to  Procedure(s): ESOPHAGOGASTRODUODENOSCOPY (EGD) WITH PROPOFOL and removal of food impaction (N/A) as a surgical intervention.  The patient's history has been reviewed, patient examined, no change in status, stable for surgery.  I have reviewed the patient's chart and labs.  Questions were answered to the patient's satisfaction.     Silvano Rusk

## 2018-09-03 NOTE — Anesthesia Procedure Notes (Signed)
Procedure Name: Intubation Date/Time: 09/03/2018 10:04 AM Performed by: Wilburn Cornelia, CRNA Pre-anesthesia Checklist: Patient identified, Emergency Drugs available, Suction available, Patient being monitored and Timeout performed Patient Re-evaluated:Patient Re-evaluated prior to induction Oxygen Delivery Method: Circle system utilized Preoxygenation: Pre-oxygenation with 100% oxygen Induction Type: Rapid sequence, IV induction and Cricoid Pressure applied Laryngoscope Size: Mac and 3 Grade View: Grade I Tube type: Oral Tube size: 7.0 mm Number of attempts: 1 Airway Equipment and Method: Stylet Placement Confirmation: ETT inserted through vocal cords under direct vision,  positive ETCO2,  CO2 detector and breath sounds checked- equal and bilateral Secured at: 21 cm Tube secured with: Tape Dental Injury: Teeth and Oropharynx as per pre-operative assessment

## 2018-09-03 NOTE — Transfer of Care (Signed)
Immediate Anesthesia Transfer of Care Note  Patient: Tiffany Barker  Procedure(s) Performed: ESOPHAGOGASTRODUODENOSCOPY (EGD) WITH PROPOFOL and removal of food impaction (N/A ) FOREIGN BODY REMOVAL BALLOON DILATION (N/A )  Patient Location: Endoscopy Unit  Anesthesia Type:General  Level of Consciousness: awake, alert  and oriented  Airway & Oxygen Therapy: Patient Spontanous Breathing and Patient connected to nasal cannula oxygen  Post-op Assessment: Report given to RN and Post -op Vital signs reviewed and stable  Post vital signs: Reviewed and stable  Last Vitals:  Vitals Value Taken Time  BP 138/85 09/03/18 1032  Temp 36.6 C 09/03/18 1032  Pulse 92 09/03/18 1034  Resp 23 09/03/18 1034  SpO2 98 % 09/03/18 1034  Vitals shown include unvalidated device data.  Last Pain:  Vitals:   09/03/18 1032  TempSrc: Temporal  PainSc: 0-No pain         Complications: No apparent anesthesia complications

## 2018-09-04 ENCOUNTER — Encounter (HOSPITAL_COMMUNITY): Payer: Self-pay | Admitting: Internal Medicine

## 2018-10-01 ENCOUNTER — Ambulatory Visit: Payer: Self-pay | Admitting: Gastroenterology

## 2018-10-01 NOTE — Progress Notes (Deleted)
Referring Provider: Isa Rankin Primary Care Physician:  Isa Rankin, NP  Chief complaint:  Recent food impaction   IMPRESSION:  Distal esophageal stricture with recent esophageal food impaction Longstanding history of GERD controlled on daily proton pump inhibitor therapy Cholecystectomy for cholelithiasis 2015 Colonoscopy in [facility] in 2010 revealing diverticulosis Family history of colon polyps (brother)  PLAN: Colonoscopy at the patient's convenience  Please see the "Patient Instructions" section for addition details about the plan.  HPI: Tiffany Barker is a 58 y.o. female seen in follow-up after her recent ED visit for food in the distal esophagus.  Dr. Arelia Longest performed an upper endoscopy 09/03/2018 showing food in the distal esophagus.  The food was chopped with a snare and advanced into the stomach with scope pressure.  He also identified a benign, intrinsic moderate stenosis in the distal esophagus measuring 1.5 cm.  The stenosis was dilated with a 15-16.5-18 mm balloon dilator.  The exam was otherwise normal.  She was discharged from the ER on omeprazole 40 mg daily.  Brother with colon polyps.  No other known family history of colon cancer or polyps. No family history of uterine/endometrial cancer, pancreatic cancer or gastric/stomach cancer.   Past Medical History:  Diagnosis Date  . Cholelithiasis   . Diverticulosis    Colonoscopy 2010 - [place] [facility]  . Essential hypertension, benign   . GERD (gastroesophageal reflux disease)   . Panic attacks     Past Surgical History:  Procedure Laterality Date  . BACK SURGERY    . BALLOON DILATION N/A 09/03/2018   Procedure: BALLOON DILATION;  Surgeon: Gatha Mayer, MD;  Location: [facility] ENDOSCOPY;  Service: Endoscopy;  Laterality: N/A;  . CARDIAC CATHETERIZATION  2011  . Chest cyst removal    . CHOLECYSTECTOMY N/A 10/31/2013   Procedure: LAPAROSCOPIC CHOLECYSTECTOMY;  Surgeon:  Jamesetta So, MD;  Location: [facility] ORS;  Service: General;  Laterality: N/A;  . COLONOSCOPY  2010  . ESOPHAGOGASTRODUODENOSCOPY (EGD) WITH PROPOFOL N/A 09/03/2018   Procedure: ESOPHAGOGASTRODUODENOSCOPY (EGD) WITH PROPOFOL and removal of food impaction;  Surgeon: Gatha Mayer, MD;  Location: [facility];  Service: Endoscopy;  Laterality: N/A;  . FOREIGN BODY REMOVAL  09/03/2018   Procedure: FOREIGN BODY REMOVAL;  Surgeon: Gatha Mayer, MD;  Location: [place];  Service: Endoscopy;;  . TUBAL LIGATION      Prior to Admission medications   Medication Sig Start Date End Date Taking? Authorizing Provider  aspirin EC 81 MG tablet Take 81 mg by mouth daily.    [provider]  cyclobenzaprine (FLEXERIL) 10 MG tablet Take 10 mg by mouth 3 (three) times daily as needed for muscle spasms.    [provider]  diphenhydrAMINE (BENADRYL) 25 mg capsule Take 25 mg by mouth at bedtime.    [provider]  furosemide (LASIX) 20 MG tablet Take 20 mg by mouth daily.     [provider]  metoprolol tartrate (LOPRESSOR) 25 MG tablet Take 75 mg by mouth 2 (two) times daily.     [provider]  omeprazole (PRILOSEC) 40 MG capsule Take 1 capsule (40 mg total) by mouth daily before breakfast. 09/03/18   Gatha Mayer, MD    Current Outpatient Medications  Medication Sig Dispense Refill  . aspirin EC 81 MG tablet Take 81 mg by mouth daily.    . cyclobenzaprine (FLEXERIL) 10 MG tablet Take 10 mg by mouth 3 (three) times daily as needed for muscle spasms.    Marland Kitchen  diphenhydrAMINE (BENADRYL) 25 mg capsule Take 25 mg by mouth at bedtime.    . furosemide (LASIX) 20 MG tablet Take 20 mg by mouth daily.     . metoprolol tartrate (LOPRESSOR) 25 MG tablet Take 75 mg by mouth 2 (two) times daily.     Marland Kitchen omeprazole (PRILOSEC) 40 MG capsule Take 1 capsule (40 mg total) by mouth daily before breakfast. 90 capsule 3   No current facility-administered medications for this visit.      Allergies as of 10/01/2018 - Review Complete 09/03/2018  Allergen Reaction Noted  . Wasp venom Anaphylaxis 10/21/2010  . Percocet [oxycodone-acetaminophen] Itching and Nausea And Vomiting 10/21/2010    Family History  Problem Relation Age of Onset  . Coronary artery disease Mother        CAD in her 45's  . Coronary artery disease Father        CAD in his 67's  . Stroke Father     Social History   Socioeconomic History  . Marital status: Divorced    Spouse name: Not on file  . Number of children: Not on file  . Years of education: Not on file  . Highest education level: Not on file  Occupational History  . Occupation: Electrical engineer    Comment: Self employed  Social Needs  . Financial resource strain: Not on file  . Food insecurity    Worry: Not on file    Inability: Not on file  . Transportation needs    Medical: Not on file    Non-medical: Not on file  Tobacco Use  . Smoking status: Former Smoker    Packs/day: 0.50    Years: 2.50    Pack years: 1.25    Types: Cigarettes    Quit date: 02/21/1980    Years since quitting: 38.6  . Smokeless tobacco: Never Used  Substance and Sexual Activity  . Alcohol use: Yes    Comment: Occasionaly/Once or twice a week (Beer)  . Drug use: No  . Sexual activity: Yes    Birth control/protection: Surgical  Lifestyle  . Physical activity    Days per week: Not on file    Minutes per session: Not on file  . Stress: Not on file  Relationships  . Social Herbalist on phone: Not on file    Gets together: Not on file    Attends religious service: Not on file    Active member of club or organization: Not on file    Attends meetings of clubs or organizations: Not on file    Relationship status: Not on file  . Intimate partner violence    Fear of current or ex partner: Not on file    Emotionally abused: Not on file    Physically abused: Not on file    Forced sexual activity: Not on file  Other Topics Concern  . Not  on file  Social History Narrative  . Not on file    Review of Systems: 12 system ROS is negative except as noted above.   Physical Exam: General:   Alert,  well-nourished, pleasant and cooperative in NAD Head:  Normocephalic and atraumatic. Eyes:  Sclera clear, no icterus.   Conjunctiva pink. Ears:  Normal auditory acuity. Nose:  No deformity, discharge,  or lesions. Mouth:  No deformity or lesions.   Neck:  Supple; no masses or thyromegaly. Lungs:  Clear throughout to auscultation.   No wheezes. Heart:  Regular rate and rhythm; no  murmurs. Abdomen:  Soft,nontender, nondistended, normal bowel sounds, no rebound or guarding. No hepatosplenomegaly.   Rectal:  Deferred  Msk:  Symmetrical. No boney deformities LAD: No inguinal or umbilical LAD Extremities:  No clubbing or edema. Neurologic:  Alert and  oriented x4;  grossly nonfocal Skin:  Intact without significant lesions or rashes. Psych:  Alert and cooperative. Normal mood and affect.   Leesa Leifheit L. Tarri Glenn, MD, MPH 10/01/2018, 2:10 AM

## 2019-11-20 ENCOUNTER — Other Ambulatory Visit (HOSPITAL_COMMUNITY): Payer: Self-pay | Admitting: Physician Assistant

## 2019-11-20 ENCOUNTER — Ambulatory Visit (HOSPITAL_COMMUNITY)
Admission: RE | Admit: 2019-11-20 | Discharge: 2019-11-20 | Disposition: A | Discharge status: Home / Self Care | Payer: Self-pay | Source: Ambulatory Visit | Attending: Physician Assistant | Admitting: Physician Assistant

## 2019-11-20 ENCOUNTER — Other Ambulatory Visit: Payer: Self-pay

## 2019-11-20 DIAGNOSIS — M79642 Pain in left hand: Secondary | ICD-10-CM

## 2021-06-15 ENCOUNTER — Emergency Department (HOSPITAL_COMMUNITY): Payer: BC Managed Care – PPO

## 2021-06-15 ENCOUNTER — Other Ambulatory Visit: Payer: Self-pay

## 2021-06-15 ENCOUNTER — Observation Stay (HOSPITAL_COMMUNITY)
Admission: EM | Admit: 2021-06-15 | Discharge: 2021-06-16 | Disposition: A | Discharge status: Home / Self Care | Payer: BC Managed Care – PPO | Attending: Internal Medicine | Admitting: Internal Medicine

## 2021-06-15 ENCOUNTER — Encounter (HOSPITAL_COMMUNITY): Payer: Self-pay

## 2021-06-15 DIAGNOSIS — Z7982 Long term (current) use of aspirin: Secondary | ICD-10-CM | POA: Diagnosis not present

## 2021-06-15 DIAGNOSIS — E1169 Type 2 diabetes mellitus with other specified complication: Secondary | ICD-10-CM | POA: Diagnosis not present

## 2021-06-15 DIAGNOSIS — K219 Gastro-esophageal reflux disease without esophagitis: Secondary | ICD-10-CM

## 2021-06-15 DIAGNOSIS — E119 Type 2 diabetes mellitus without complications: Secondary | ICD-10-CM | POA: Diagnosis not present

## 2021-06-15 DIAGNOSIS — F32A Depression, unspecified: Secondary | ICD-10-CM | POA: Diagnosis present

## 2021-06-15 DIAGNOSIS — I200 Unstable angina: Secondary | ICD-10-CM

## 2021-06-15 DIAGNOSIS — Z794 Long term (current) use of insulin: Secondary | ICD-10-CM | POA: Insufficient documentation

## 2021-06-15 DIAGNOSIS — E669 Obesity, unspecified: Secondary | ICD-10-CM | POA: Insufficient documentation

## 2021-06-15 DIAGNOSIS — E782 Mixed hyperlipidemia: Secondary | ICD-10-CM | POA: Diagnosis present

## 2021-06-15 DIAGNOSIS — Z7984 Long term (current) use of oral hypoglycemic drugs: Secondary | ICD-10-CM | POA: Diagnosis not present

## 2021-06-15 DIAGNOSIS — R55 Syncope and collapse: Secondary | ICD-10-CM

## 2021-06-15 DIAGNOSIS — Z6836 Body mass index (BMI) 36.0-36.9, adult: Secondary | ICD-10-CM | POA: Insufficient documentation

## 2021-06-15 DIAGNOSIS — Z87891 Personal history of nicotine dependence: Secondary | ICD-10-CM | POA: Diagnosis not present

## 2021-06-15 DIAGNOSIS — R079 Chest pain, unspecified: Principal | ICD-10-CM | POA: Diagnosis present

## 2021-06-15 DIAGNOSIS — I10 Essential (primary) hypertension: Secondary | ICD-10-CM | POA: Diagnosis not present

## 2021-06-15 DIAGNOSIS — E66812 Obesity, class 2: Secondary | ICD-10-CM | POA: Diagnosis present

## 2021-06-15 HISTORY — DX: Essential (primary) hypertension: I10

## 2021-06-15 HISTORY — DX: Type 2 diabetes mellitus without complications: E11.9

## 2021-06-15 LAB — BASIC METABOLIC PANEL
Anion gap: 8 (ref 5–15)
BUN: 14 mg/dL (ref 6–20)
CO2: 26 mmol/L (ref 22–32)
Calcium: 9.3 mg/dL (ref 8.9–10.3)
Chloride: 102 mmol/L (ref 98–111)
Creatinine, Ser: 0.73 mg/dL (ref 0.44–1.00)
GFR, Estimated: >60 mL/min (ref >60)
Glucose, Bld: 104 mg/dL — ABNORMAL HIGH (ref 70–99)
Potassium: 3.8 mmol/L (ref 3.5–5.1)
Sodium: 136 mmol/L (ref 135–145)

## 2021-06-15 LAB — CBC
HCT: 38.5 % (ref 36.0–46.0)
Hemoglobin: 12.6 g/dL (ref 12.0–15.0)
MCH: 30.2 pg (ref 26.0–34.0)
MCHC: 32.7 g/dL (ref 30.0–36.0)
MCV: 92.3 fL (ref 80.0–100.0)
Platelets: 355 10*3/uL (ref 150–400)
RBC: 4.17 MIL/uL (ref 3.87–5.11)
RDW: 14.1 % (ref 11.5–15.5)
WBC: 12.5 10*3/uL — ABNORMAL HIGH (ref 4.0–10.5)
nRBC: 0 % (ref 0.0–0.2)

## 2021-06-15 LAB — TROPONIN I (HIGH SENSITIVITY)
Troponin I (High Sensitivity): <2 ng/L (ref <18)
Troponin I (High Sensitivity): 2 ng/L (ref <18)

## 2021-06-15 MED ORDER — ACETAMINOPHEN 325 MG PO TABS: 650.0000 mg | ORAL_TABLET | ORAL | Status: DC | PRN

## 2021-06-15 MED ORDER — DULOXETINE HCL 60 MG PO CPEP: 60.0000 mg | ORAL_CAPSULE | Freq: Every day | ORAL | Status: DC

## 2021-06-15 MED ORDER — SODIUM CHLORIDE 0.9 % IV SOLN: INTRAVENOUS | Status: DC

## 2021-06-15 MED ORDER — IOHEXOL 350 MG/ML SOLN
100.0000 mL | Freq: Once | INTRAVENOUS | Status: AC | PRN
  Administered 2021-06-15: 80 mL via INTRAVENOUS

## 2021-06-15 MED ORDER — NITROGLYCERIN 0.4 MG SL SUBL: 0.4000 mg | SUBLINGUAL_TABLET | SUBLINGUAL | Status: DC

## 2021-06-15 MED ORDER — ASPIRIN EC 81 MG PO TBEC: 81.0000 mg | DELAYED_RELEASE_TABLET | Freq: Every day | ORAL | Status: DC

## 2021-06-15 MED ORDER — HEPARIN SODIUM (PORCINE) 5000 UNIT/ML IJ SOLN
5000.0000 [IU] | Freq: Three times a day (TID) | INTRAMUSCULAR | Status: DC
  Administered 2021-06-15 – 2021-06-16 (×2): 5000 [IU] via SUBCUTANEOUS
  Filled 2021-06-15 (×2): qty 1

## 2021-06-15 MED ORDER — METOPROLOL TARTRATE 50 MG PO TABS
75.0000 mg | ORAL_TABLET | Freq: Two times a day (BID) | ORAL | Status: DC
  Administered 2021-06-15: 75 mg via ORAL
  Filled 2021-06-15: qty 1

## 2021-06-15 MED ORDER — ASPIRIN 325 MG PO TABS
325.0000 mg | ORAL_TABLET | Freq: Every day | ORAL | Status: DC
  Administered 2021-06-15: 325 mg via ORAL
  Filled 2021-06-15: qty 1

## 2021-06-15 MED ORDER — SODIUM CHLORIDE 0.9% FLUSH
3.0000 mL | Freq: Two times a day (BID) | INTRAVENOUS | Status: DC
  Administered 2021-06-15: 3 mL via INTRAVENOUS

## 2021-06-15 MED ORDER — PANTOPRAZOLE SODIUM 40 MG PO TBEC: 40.0000 mg | DELAYED_RELEASE_TABLET | Freq: Every day | ORAL | Status: DC

## 2021-06-15 MED ORDER — ONDANSETRON HCL 4 MG/2ML IJ SOLN: 4.0000 mg | Freq: Four times a day (QID) | INTRAMUSCULAR | Status: DC | PRN

## 2021-06-15 NOTE — Consult Note (Signed)
? ?Cardiology Consultation:  ? ?Patient ID: Tiffany Barker; [id number]; [birth year]  ? ?Admit date: 06/15/2021 ?Date of Consult: 06/15/2021 ? ?Primary Care Provider: Sofie Rower, PA-C ?Primary Cardiologist: Rozann Lesches, MD ?Primary Electrophysiologist: None ? ? ?Patient Profile:  ? ?Tiffany Barker is a 61 y.o. female with a history of essential hypertension, GERD, type 2 diabetes mellitus, normal coronary arteries in 2012, and family history of premature CAD who is being seen today for the evaluation of accelerating chest discomfort and recent episode of syncope at the request of Dr. Waldron Labs. ? ?History of Present Illness:  ? ?[name] presents to the ER referred after visit with her PCP today.  She states that over the last few months she has been having recurrent episodes of chest tightness and fatigue.  This has been notable with exertion and emotional stress.  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  About two weeks ago when she was at home she developed a severe episode of chest tightness that was prolonged and at rest.  She went to bed and woke up the next day feeling very fatigued and not herself.  Since that time she has had more exertional fatigue, continued to have exertional chest tightness, and yesterday while at work after lifting a heavy pain of glass and bending over, she stood back up and had a brief syncopal event. ? ?Previous cardiac work-up includes a cardiac catheterization in 2012 that showed normal coronary arteries.  She reports diagnosis of type 2 diabetes mellitus about a year ago. ? ?Initial high-sensitivity troponin I level is normal and her ECG shows diffuse nonspecific T wave abnormalities. ? ?Past Medical History:  ?Diagnosis Date  ? Cholelithiasis   ? Diverticulosis   ? Colonoscopy 2010 - [facility]  ? Essential hypertension   ? GERD (gastroesophageal reflux disease)   ? Panic attacks   ? Type 2 diabetes mellitus ([place])   ? ? ?Past  Surgical History:  ?Procedure Laterality Date  ? BACK SURGERY    ? BALLOON DILATION N/A 09/03/2018  ? Procedure: BALLOON DILATION;  Surgeon: Gatha Mayer, MD;  Location: [facility] ENDOSCOPY;  Service: Endoscopy;  Laterality: N/A;  ? CARDIAC CATHETERIZATION  2011  ? Chest cyst removal    ? CHOLECYSTECTOMY N/A 10/31/2013  ? Procedure: LAPAROSCOPIC CHOLECYSTECTOMY;  Surgeon: Jamesetta So, MD;  Location: [facility];  Service: General;  Laterality: N/A;  ? COLONOSCOPY  2010  ? ESOPHAGOGASTRODUODENOSCOPY (EGD) WITH PROPOFOL N/A 09/03/2018  ? Procedure: ESOPHAGOGASTRODUODENOSCOPY (EGD) WITH PROPOFOL and removal of food impaction;  Surgeon: Gatha Mayer, MD;  Location: [facility] ENDOSCOPY;  Service: Endoscopy;  Laterality: N/A;  ? FOREIGN BODY REMOVAL  09/03/2018  ? Procedure: FOREIGN BODY REMOVAL;  Surgeon: Gatha Mayer, MD;  Location: [place];  Service: Endoscopy;;  ? TUBAL LIGATION    ?  ? ?Inpatient Medications: ?Scheduled Meds: ? aspirin  325 mg Oral Daily  ? [START ON 06/16/2021] DULoxetine  60 mg Oral Daily  ? heparin  5,000 Units Subcutaneous Q8H  ? metoprolol tartrate  75 mg Oral BID  ? nitroGLYCERIN  0.4 mg Sublingual UD  ? [START ON 06/16/2021] pantoprazole  40 mg Oral Daily  ? ?Continuous Infusions: ? sodium chloride    ? ?PRN Meds: ?acetaminophen, ondansetron (ZOFRAN) IV ? ?Allergies:    ?Allergies  ?Allergen Reactions  ? Wasp Venom Anaphylaxis  ? Percocet [Oxycodone-Acetaminophen] Itching and Nausea And Vomiting  ? ? ?Social History:   ?Social  History  ? ?Tobacco Use  ? Smoking status: Former  ?  Packs/day: 0.50  ?  Years: 2.50  ?  Pack years: 1.25  ?  Types: Cigarettes  ?  Quit date: 02/21/1980  ?  Years since quitting: 41.3  ? Smokeless tobacco: Never  ?Substance Use Topics  ? Alcohol use: Yes  ?  Comment: Occasionaly/Once or twice a week (Beer)  ?  ? ?Family History:   ?The patient's family history includes Coronary artery disease in her father and mother; Stroke in her father. ? ?ROS:  ?Please see the history of  present illness.  ?All other ROS reviewed and negative.    ? ?Physical Exam/Data:  ? ?Vitals:  ? 06/15/21 1440 06/15/21 1500 06/15/21 1530 06/15/21 1600  ?BP: 133/85 133/88 129/77 133/80  ?Pulse: 77 72 73 73  ?Resp: '16 17 18 19  '$ ?Temp:      ?TempSrc:      ?SpO2: 99% 98% 97% 98%  ?Weight:      ?Height:      ? ?No intake or output data in the 24 hours ending 06/15/21 1640 ?Filed Weights  ? 06/15/21 1238  ?Weight: 93.9 kg  ? ?Body mass index is 36.67 kg/m?.  ? ?Gen: Patient appears comfortable at rest. ?HEENT: Conjunctiva and lids normal, oropharynx clear. ?Neck: Supple, no elevated JVP or carotid bruits, no thyromegaly. ?Lungs: Clear to auscultation, nonlabored breathing at rest. ?Cardiac: Regular rate and rhythm, no S3 or significant systolic murmur, no pericardial rub. ?Abdomen: Soft, nontender, bowel sounds present, no guarding or rebound. ?Extremities: No pitting edema, distal pulses 2+. ?Skin: Warm and dry. ?Musculoskeletal: No kyphosis. ?Neuropsychiatric: Alert and oriented x3, affect grossly appropriate. ? ?EKG:  An ECG dated 06/15/2021 was personally reviewed today and demonstrated:  Sinus rhythm with diffuse nonspecific T wave changes. ? ?Telemetry:  I personally reviewed telemetry which shows sinus rhythm. ? ?Relevant CV Studies: ? ?Cardiac catheterization November 2012: ?Coronary angiography:  ?Coronary dominance: Right ?  ?Left mainstem:   Normal ?  ?Left anterior descending (LAD):   Wraps the apex and normal throughout its course. First diagonal small normal. Second diagonal small normal. ?  ?Left circumflex (LCx):  AV groove normal. Small ramus intermediate normal. Large first obtuse marginal normal. ?  ?Right coronary artery (RCA):  Dominant but somewhat small vessel normal throughout its course. Small PDA normal. ?  ?Left ventriculography: Left ventricular systolic function is normal, LVEF is estimated at 55-65%, there is no significant mitral regurgitation  ?  ?Final Conclusions:  Normal coronary  arteries. Well-preserved ejection fraction. ? ?Laboratory Data: ? ?Chemistry ?Recent Labs  ?Lab 06/15/21 ?1320  ?NA 136  ?K 3.8  ?CL 102  ?CO2 26  ?GLUCOSE 104*  ?BUN 14  ?CREATININE 0.73  ?CALCIUM 9.3  ?GFRNONAA >60  ?ANIONGAP 8  ?  ?No results for input(s): PROT, ALBUMIN, AST, ALT, ALKPHOS, BILITOT in the last 168 hours. ?Hematology ?Recent Labs  ?Lab 06/15/21 ?1320  ?WBC 12.5*  ?RBC 4.17  ?HGB 12.6  ?HCT 38.5  ?MCV 92.3  ?MCH 30.2  ?MCHC 32.7  ?RDW 14.1  ?PLT 355  ? ?Cardiac Enzymes ?Recent Labs  ?Lab 06/15/21 ?1320 06/15/21 ?1444  ?TROPONINIHS <2 2  ? ? ?Radiology/Studies:  ?DG Chest 2 View ? ?Result Date: 06/15/2021 ?CLINICAL DATA:  Chest pain EXAM: CHEST - 2 VIEW COMPARISON:  2017 FINDINGS: The heart size and mediastinal contours are within normal limits. Both lungs are clear. No pleural effusion or pneumothorax. The visualized skeletal structures are unremarkable.  IMPRESSION: No acute process in the chest. Electronically Signed   By: Macy Mis M.D.   On: 06/15/2021 13:54  ? ?CT Angio Chest PE W and/or Wo Contrast ? ?Result Date: 06/15/2021 ?CLINICAL DATA:  Chest pain and shortness of breath since this morning EXAM: CT ANGIOGRAPHY CHEST WITH CONTRAST TECHNIQUE: Multidetector CT imaging of the chest was performed using the standard protocol during bolus administration of intravenous contrast. Multiplanar CT image reconstructions and MIPs were obtained to evaluate the vascular anatomy. RADIATION DOSE REDUCTION: This exam was performed according to the departmental dose-optimization program which includes automated exposure control, adjustment of the mA and/or kV according to patient size and/or use of iterative reconstruction technique. CONTRAST:  62m OMNIPAQUE IOHEXOL 350 MG/ML SOLN COMPARISON:  06/15/2021 chest x-ray FINDINGS: Cardiovascular: Pulmonary arteries are normal in caliber and appear patent. No significant filling defect or pulmonary embolus by CTA. Intact thoracic aorta with trace  atherosclerotic change. Patent 3 vessel arch anatomy. No aneurysm or dissection. No mediastinal hemorrhage or hematoma. Normal heart size. No pericardial effusion. Central venous structures are patent.  No veno-occlusive find

## 2021-06-15 NOTE — ED Notes (Signed)
Patient transported to X-ray 

## 2021-06-15 NOTE — ED Notes (Signed)
Pt has a urine sample at bedside.  ?

## 2021-06-15 NOTE — H&P (Signed)
? ?                                                                                                       [facility] H&P ? ? Patient Demographics:  ? ? Tiffany Barker, is a 61 y.o. female  MRN: [id number]   DOB - November 28, 1960 ? ?Admit Date - 06/15/2021 ? ?Outpatient Primary MD for the patient is [place], [place], PA-C ? ?Referring MD/NP/PA: Dr Lowella Dell ? ?Patient coming from: home>> urgent care ? ?Chief Complaint  ?Patient presents with  ? Chest Pain  ?  ? ? HPI:  ? ? Tiffany Barker  is a 61 y.o. female, with past medical history of hypertension, hyperlipidemia, patient presents to ED secondary to complaints of chest pain, patient reports chest pain has been going intermittently for the last month, patient with history of chest pain in 2012, work-up including Lexiscan which was unremarkable, she was kept empirically on aspirin, aspirin was continued 5-monthago due to tinnitus, without much improvement of her tinnitus, she has a follow-up with ENT next month, patient reports progressive chest pain over the last month, happening with activity, midsternal, described as tightness, radiating to the neck, reports she passed out 1 time couple weeks ago, reports another episode of passing out yesterday, reports she was lifting a heavy piece of glass at her work, she was able to put it down, she then she syncopized at work, reported dyspnea with activity, chest pain currently is more frequent, and with increased intensity, and comes with [place] activity, reports she is on sublingual nitro at home with some help recently. ?-In ED her troponins was negative, CTA chest was obtained with no evidence of PE, she is currently chest pain-free after receiving nitro, she is currently chest pain-free, [facility] hospitalist consulted to admit. ? ? ? Review of systems:  ?  ?In addition to the HPI above,  ?No Fever-chills, she reports episode of syncope. ?No Headache, No changes with Vision or hearing, ?No problems swallowing food or Liquids, ?Reports  chest pain, progressive dyspnea, denies any cough ?No Abdominal pain, No Nausea or Vommitting, Bowel movements are regular, ?No Blood in stool or Urine, ?No dysuria, ?No new skin rashes or bruises, ?No new joints pains-aches,  ?No new weakness, tingling, numbness in any extremity, ?No recent weight gain or loss, ?No polyuria, polydypsia or polyphagia, ?No significant Mental Stressors. ? ?A full 10 point Review of Systems was done, except as stated above, all other Review of Systems were negative. ? ? ?With Past History of the following :  ? ? ?Past Medical History:  ?Diagnosis Date  ? Cholelithiasis   ? [place]   ? Diverticulosis   ? [place]  ? Essential hypertension, benign   ? GERD (gastroesophageal reflux disease)   ? Panic attacks   ?   ? ?Past Surgical History:  ?Procedure Laterality Date  ? BACK SURGERY    ? BALLOON DILATION N/A 09/03/2018  ? Procedure: BALLOON DILATION;  Surgeon: GGatha Mayer MD;  [place]  Service:  Endoscopy;  Laterality: N/A;  ? CARDIAC CATHETERIZATION  2011  ? Chest cyst removal    ? CHOLECYSTECTOMY N/A 10/31/2013  ? Procedure: LAPAROSCOPIC CHOLECYSTECTOMY;  Surgeon: Jamesetta So, MD;  Location: [facility];  Service: General;  Laterality: N/A;  ? COLONOSCOPY  2010  ? ESOPHAGOGASTRODUODENOSCOPY (EGD) WITH PROPOFOL N/A 09/03/2018  ? Procedure: ESOPHAGOGASTRODUODENOSCOPY (EGD) WITH PROPOFOL and removal of food impaction;  Surgeon: Gatha Mayer, MD;  Location: [facility] ENDOSCOPY;  Service: Endoscopy;  Laterality: N/A;  ? FOREIGN BODY REMOVAL  09/03/2018  ? Procedure: FOREIGN BODY REMOVAL;  Surgeon: Gatha Mayer, MD;  Location: [place];  Service: Endoscopy;;  ? TUBAL LIGATION    ? ? ? ? Social History:  ? ?  ?Social History  ? ?Tobacco Use  ? Smoking status: Former  ?  Packs/day: 0.50  ?  Years: 2.50  ?  Pack years: 1.25  ?  Types: Cigarettes  ?  Quit date: 02/21/1980  ?  Years since quitting: 41.3  ? Smokeless  tobacco: Never  ?Substance Use Topics  ? Alcohol use: Yes  ?  Comment: Occasionaly/Once or twice a week (Beer)  ?  ? ? ? ? Family History :  ? ?  ?Family History  ?Problem Relation Age of Onset  ? Coronary artery disease Mother   ?     CAD in her 36's  ? Coronary artery disease Father   ?     CAD in his 4's  ? Stroke Father   ? ? ? ? Home Medications:  ? ?Prior to Admission medications   ?Medication Sig Start Date End Date Taking? Authorizing Provider  ?aspirin EC 81 MG tablet Take 81 mg by mouth daily.    [provider]  ?cyclobenzaprine (FLEXERIL) 10 MG tablet Take 10 mg by mouth 3 (three) times daily as needed for muscle spasms.    [provider]  ?diphenhydrAMINE (BENADRYL) 25 mg capsule Take 25 mg by mouth at bedtime.    [provider]  ?DULoxetine (CYMBALTA) 60 MG capsule Take 60 mg by mouth daily. 06/13/21   [provider]  ?furosemide (LASIX) 20 MG tablet Take 20 mg by mouth daily.     [provider]  ?metoprolol tartrate (LOPRESSOR) 25 MG tablet Take 75 mg by mouth 2 (two) times daily.     [provider]  ?nitroGLYCERIN (NITROSTAT) 0.4 MG SL tablet Place 0.4 mg under the tongue as directed. 06/11/21   [provider]  ?omeprazole (PRILOSEC) 40 MG capsule Take 1 capsule (40 mg total) by mouth daily before breakfast. 09/03/18   Gatha Mayer, MD  ?TRULICITY 1.5 GD/9.2EQ SOPN Inject 1.5 mg into the skin once a week. 06/08/21   [provider]  ? ? ? Allergies:  ? ?  ?Allergies  ?Allergen Reactions  ? Wasp Venom Anaphylaxis  ? Percocet [Oxycodone-Acetaminophen] Itching and Nausea And Vomiting  ? ? ? Physical Exam:  ? ?Vitals ? ?Blood pressure 133/88, pulse 72, temperature 98 ?F (36.7 ?C), temperature source Oral, resp. rate 17, height '5\' 3"'$  (1.6 m), weight 93.9 kg, last menstrual period 07/10/2013, SpO2 98 %. ? ? ?1. General developed female lying in bed in NAD,  ? ?2. Normal affect and insight, Not Suicidal or Homicidal, Awake Alert,  Oriented X 3. ? ?3. No F.N deficits, ALL C.Nerves Intact, Strength 5/5 all 4 extremities, Sensation intact all 4 extremities, Plantars down going. ? ?4. Ears and Eyes appear Normal, Conjunctivae clear, PERRLA. Moist Oral Mucosa. ? ?  5. Supple Neck, No JVD, No cervical lymphadenopathy appriciated, No Carotid Bruits. ? ?6. Symmetrical Chest wall movement, Good air movement bilaterally, CTAB. ? ?7. RRR, No Gallops, Rubs or Murmurs, No Parasternal Heave. ? ?8. Positive Bowel Sounds, Abdomen Soft, No tenderness, No organomegaly appriciated,No rebound -guarding or rigidity. ? ?9.  No Cyanosis, Normal Skin Turgor, No Skin Rash or Bruise. ? ?10. Good muscle tone,  joints appear normal , no effusions, Normal ROM. ? ?11. No Palpable Lymph Nodes in Neck or Axillae ? ? Data Review:  ? ? CBC ?Recent Labs  ?Lab 06/15/21 ?1320  ?WBC 12.5*  ?HGB 12.6  ?HCT 38.5  ?PLT 355  ?MCV 92.3  ?MCH 30.2  ?MCHC 32.7  ?RDW 14.1  ? ?------------------------------------------------------------------------------------------------------------------ ? ?Chemistries  ?Recent Labs  ?Lab 06/15/21 ?1320  ?NA 136  ?K 3.8  ?CL 102  ?CO2 26  ?GLUCOSE 104*  ?BUN 14  ?CREATININE 0.73  ?CALCIUM 9.3  ? ?------------------------------------------------------------------------------------------------------------------ ?estimated creatinine clearance is 83.5 mL/min (by C-G formula based on SCr of 0.73 mg/dL). ?------------------------------------------------------------------------------------------------------------------ ?No results for input(s): TSH, T4TOTAL, T3FREE, THYROIDAB in the last 72 hours. ? ?Invalid input(s): FREET3 ? ?Coagulation profile ?No results for input(s): INR, PROTIME in the last 168 hours. ?------------------------------------------------------------------------------------------------------------------- ?No results for input(s): DDIMER in the last 72  hours. ?------------------------------------------------------------------------------------------------------------------- ? ?Cardiac Enzymes ?No results for input(s): CKMB, TROPONINI, MYOGLOBIN in the last 168 hours. ? ?Invalid input(s): CK ?-------------------------------------------------------

## 2021-06-15 NOTE — ED Provider Notes (Signed)
?[facility] ?Provider Note ? ? ?CSN: [id number] ?Arrival date & time: 06/15/21  1219 ? ?  ? ?History ? ?Chief Complaint  ?Patient presents with  ? Chest Pain  ? ? ?Tiffany Barker is a 61 y.o. female. ? ?Patient presents ER chief complaint of chest pain.  She states this pain has been worse in the last week or so.  She is been having chest pain for the past month intermittently.  Describes as a tightening sensation around her chest anywhere from 1 or 3 hours at a time.  Lately has been exertional, she had an exertional episode 2 weeks ago at home when she was had chest pain tightness and then neck thing she knew she was waking up from her bed and apparently had passed out.  She had since then several episodes of chest pain again.  Last episode was yesterday she was at work lifting a heavy piece of glass.  She felt chest tightness that she was lifting the glass was able to put it down and then syncopized at work.  She was unresponsive for a minute or 2 she feels before waking up.  She went to an outpatient clinic today and was sent to the ER for evaluation.  She had a Lexi scan done in 2012 that was unremarkable.  Currently describes a low-level chest discomfort but nothing as significant that she has had in the recent past.  Denies fevers or cough or vomiting or diarrhea. ? ? ?  ? ?Home Medications ?Prior to Admission medications   ?Medication Sig Start Date End Date Taking? Authorizing Provider  ?aspirin EC 81 MG tablet Take 81 mg by mouth daily.    [provider]  ?cyclobenzaprine (FLEXERIL) 10 MG tablet Take 10 mg by mouth 3 (three) times daily as needed for muscle spasms.    [provider]  ?diphenhydrAMINE (BENADRYL) 25 mg capsule Take 25 mg by mouth at bedtime.    [provider]  ?DULoxetine (CYMBALTA) 60 MG capsule Take 60 mg by mouth daily. 06/13/21   [provider]  ?furosemide (LASIX) 20 MG tablet Take 20 mg by mouth daily.     [provider]  ?metoprolol tartrate (LOPRESSOR) 25 MG tablet Take 75 mg by mouth 2 (two) times daily.     [provider]  ?nitroGLYCERIN (NITROSTAT) 0.4 MG SL tablet Place 0.4 mg under the tongue as directed. 06/11/21   [provider]  ?omeprazole (PRILOSEC) 40 MG capsule Take 1 capsule (40 mg total) by mouth daily before breakfast. 09/03/18   Gatha Mayer, MD  ?TRULICITY 1.5 KN/3.9JQ SOPN Inject 1.5 mg into the skin once a week. 06/08/21   [provider]  ?   ? ?Allergies    ?Wasp venom and Percocet [oxycodone-acetaminophen]   ? ?Review of Systems   ?Review of Systems  ?Constitutional:  Negative for fever.  ?HENT:  Negative for ear pain.   ?Eyes:  Negative for pain.  ?Respiratory:  Negative for cough.   ?Cardiovascular:  Positive for chest pain.  ?Gastrointestinal:  Negative for abdominal pain.  ?Genitourinary:  Negative for flank pain.  ?Musculoskeletal:  Negative for back pain.  ?Skin:  Negative for rash.  ?Neurological:  Negative for headaches.  ? ?Physical Exam ?Updated Vital Signs ?BP 133/88   Pulse 72   Temp 98 ?F (36.7 ?C) (Oral)   Resp 17   Ht '5\' 3"'$  (1.6 m)   Wt 93.9 kg   LMP 07/10/2013  SpO2 98%   BMI 36.67 kg/m?  ?Physical Exam ?Constitutional:   ?   General: She is not in acute distress. ?   Appearance: Normal appearance.  ?HENT:  ?   Head: Normocephalic.  ?   Nose: Nose normal.  ?Eyes:  ?   Extraocular Movements: Extraocular movements intact.  ?Cardiovascular:  ?   Rate and Rhythm: Normal rate.  ?Pulmonary:  ?   Effort: Pulmonary effort is normal.  ?Musculoskeletal:     ?   General: Normal range of motion.  ?   Cervical back: Normal range of motion.  ?Neurological:  ?   General: No focal deficit present.  ?   Mental Status: She is alert. Mental status is at baseline.  ? ? ?ED Results / Procedures / Treatments   ?Labs ?(all labs ordered are listed, but only abnormal results are displayed) ?Labs Reviewed  ?BASIC METABOLIC PANEL - Abnormal; Notable for the following  components:  ?    Result Value  ? Glucose, Bld 104 (*)   ? All other components within normal limits  ?CBC - Abnormal; Notable for the following components:  ? WBC 12.5 (*)   ? All other components within normal limits  ?TROPONIN I (HIGH SENSITIVITY)  ?TROPONIN I (HIGH SENSITIVITY)  ? ? ?EKG ?EKG Interpretation ? ?Date/Time:  Wednesday June 15 2021 12:39:37 EDT ?Ventricular Rate:  70 ?PR Interval:  130 ?QRS Duration: 76 ?QT Interval:  404 ?QTC Calculation: 436 ?R Axis:   -10 ?Text Interpretation: Normal sinus rhythm Possible Inferior infarct , age undetermined Cannot rule out Anterior infarct , age undetermined Abnormal ECG When compared with ECG of 03-Sep-2018 03:49, PREVIOUS ECG IS PRESENT Confirmed by Thamas Jaegers ([id number]) on 06/15/2021 12:56:23 PM ? ?Radiology ?DG Chest 2 View ? ?Result Date: 06/15/2021 ?CLINICAL DATA:  Chest pain EXAM: CHEST - 2 VIEW COMPARISON:  2017 FINDINGS: The heart size and mediastinal contours are within normal limits. Both lungs are clear. No pleural effusion or pneumothorax. The visualized skeletal structures are unremarkable. IMPRESSION: No acute process in the chest. Electronically Signed   By: Macy Mis M.D.   On: 06/15/2021 13:54  ? ?CT Angio Chest PE W and/or Wo Contrast ? ?Result Date: 06/15/2021 ?CLINICAL DATA:  Chest pain and shortness of breath since this morning EXAM: CT ANGIOGRAPHY CHEST WITH CONTRAST TECHNIQUE: Multidetector CT imaging of the chest was performed using the standard protocol during bolus administration of intravenous contrast. Multiplanar CT image reconstructions and MIPs were obtained to evaluate the vascular anatomy. RADIATION DOSE REDUCTION: This exam was performed according to the departmental dose-optimization program which includes automated exposure control, adjustment of the mA and/or kV according to patient size and/or use of iterative reconstruction technique. CONTRAST:  62m OMNIPAQUE IOHEXOL 350 MG/ML SOLN COMPARISON:  06/15/2021 chest x-ray  FINDINGS: Cardiovascular: Pulmonary arteries are normal in caliber and appear patent. No significant filling defect or pulmonary embolus by CTA. Intact thoracic aorta with trace atherosclerotic change. Patent 3 vessel arch anatomy. No aneurysm or dissection. No mediastinal hemorrhage or hematoma. Normal heart size. No pericardial effusion. Central venous structures are patent.  No veno-occlusive finding. Mediastinum/Nodes: No enlarged mediastinal, hilar, or axillary lymph nodes. Thyroid gland, trachea, and esophagus demonstrate no significant findings. Lungs/Pleura: Lungs are clear. No pleural effusion or pneumothorax. Slight nonspecific pleural fatty proliferation more so on the right than the left. Otherwise no other pleural abnormality, effusion, or pneumothorax. Upper Abdomen: Mild hepatic steatosis noted. No acute upper abdominal finding. Mesenteric and renal vascular visualized appear  patent. Musculoskeletal: Right thoracic upper back subcutaneous well-circumscribed nodule extending to the skin surface measures 12 mm, image 21/4 suspect sebaceous cyst. No other chest wall soft tissue abnormality or asymmetry. No acute osseous finding. Review of the MIP images confirms the above findings. IMPRESSION: Negative for significant acute pulmonary embolus by CTA. No other acute intrathoracic finding. Aortic Atherosclerosis (ICD10-I70.0). Electronically Signed   By: Jerilynn Mages.  Shick M.D.   On: 06/15/2021 14:40   ? ?Procedures ?Procedures  ? ? ?Medications Ordered in ED ?Medications  ?iohexol (OMNIPAQUE) 350 MG/ML injection 100 mL (80 mLs Intravenous Contrast Given 06/15/21 1431)  ? ? ?ED Course/ Medical Decision Making/ A&P ?  ?                        ?Medical Decision Making ?Amount and/or Complexity of Data Reviewed ?Radiology: ordered. ? ?Risk ?Prescription drug management. ? ? ?Review of records shows GI evaluation in 2020 for strictures. ? ?Cardiac monitoring showing sinus rhythm normal rate. ? ?Today includes labs EKG is  unremarkable.  Troponin is negative first set, second set pending.  CBC and chemistry unremarkable as well. ? ?Patient has a negative work-up for ACS here today, however her history is very concerning given the exertiona

## 2021-06-15 NOTE — H&P (View-Only) (Signed)
? ?Cardiology Consultation:  ? ?Patient ID: Tiffany Barker; [id number]; 08/14/1960  ? ?Admit date: 06/15/2021 ?Date of Consult: 06/15/2021 ? ?Primary Care Provider: Sofie Rower, PA-C ?Primary Cardiologist: Tiffany Lesches, MD ?Primary Electrophysiologist: None ? ? ?Patient Profile:  ? ?Tiffany Barker is a 61 y.o. female with a history of essential hypertension, GERD, type 2 diabetes mellitus, normal coronary arteries in 2012, and family history of premature CAD who is being seen today for the evaluation of accelerating chest discomfort and recent episode of syncope at the request of Dr. Waldron Labs. ? ?History of Present Illness:  ? ?[name] presents to the ER referred after visit with her PCP today.  She states that over the last few months she has been having recurrent episodes of chest tightness and fatigue.  This has been notable with exertion and emotional stress.  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  About two weeks ago when she was at home she developed a severe episode of chest tightness that was prolonged and at rest.  She went to bed and woke up the next day feeling very fatigued and not herself.  Since that time she has had more exertional fatigue, continued to have exertional chest tightness, and yesterday while at work after lifting a heavy pain of glass and bending over, she stood back up and had a brief syncopal event. ? ?Previous cardiac work-up includes a cardiac catheterization in 2012 that showed normal coronary arteries.  She reports diagnosis of type 2 diabetes mellitus about a year ago. ? ?Initial high-sensitivity troponin I level is normal and her ECG shows diffuse nonspecific T wave abnormalities. ? ?Past Medical History:  ?Diagnosis Date  ? Cholelithiasis   ? Diverticulosis   ? Colonoscopy 2010 - [facility]  ? Essential hypertension   ? GERD (gastroesophageal reflux disease)   ? Panic attacks   ? Type 2 diabetes mellitus ([place])   ? ? ?Past  Surgical History:  ?Procedure Laterality Date  ? BACK SURGERY    ? BALLOON DILATION N/A 09/03/2018  ? Procedure: BALLOON DILATION;  Surgeon: Tiffany Mayer, MD;  Location: [facility] ENDOSCOPY;  Service: Endoscopy;  Laterality: N/A;  ? CARDIAC CATHETERIZATION  2011  ? Chest cyst removal    ? CHOLECYSTECTOMY N/A 10/31/2013  ? Procedure: LAPAROSCOPIC CHOLECYSTECTOMY;  Surgeon: Tiffany So, MD;  Location: [facility] ORS;  Service: General;  Laterality: N/A;  ? COLONOSCOPY  2010  ? ESOPHAGOGASTRODUODENOSCOPY (EGD) WITH PROPOFOL N/A 09/03/2018  ? Procedure: ESOPHAGOGASTRODUODENOSCOPY (EGD) WITH PROPOFOL and removal of food impaction;  Surgeon: Tiffany Mayer, MD;  Location: [facility] ENDOSCOPY;  Service: Endoscopy;  Laterality: N/A;  ? FOREIGN BODY REMOVAL  09/03/2018  ? Procedure: FOREIGN BODY REMOVAL;  Surgeon: Tiffany Mayer, MD;  Location: [facility];  Service: Endoscopy;;  ? TUBAL LIGATION    ?  ? ?Inpatient Medications: ?Scheduled Meds: ? aspirin  325 mg Oral Daily  ? [START ON 06/16/2021] DULoxetine  60 mg Oral Daily  ? heparin  5,000 Units Subcutaneous Q8H  ? metoprolol tartrate  75 mg Oral BID  ? nitroGLYCERIN  0.4 mg Sublingual UD  ? [START ON 06/16/2021] pantoprazole  40 mg Oral Daily  ? ?Continuous Infusions: ? sodium chloride    ? ?PRN Meds: ?acetaminophen, ondansetron (ZOFRAN) IV ? ?Allergies:    ?Allergies  ?Allergen Reactions  ? Wasp Venom Anaphylaxis  ? Percocet [Oxycodone-Acetaminophen] Itching and Nausea And Vomiting  ? ? ?Social History:   ?Social  History  ? ?Tobacco Use  ? Smoking status: Former  ?  Packs/day: 0.50  ?  Years: 2.50  ?  Pack years: 1.25  ?  Types: Cigarettes  ?  Quit date: 02/21/1980  ?  Years since quitting: 41.3  ? Smokeless tobacco: Never  ?Substance Use Topics  ? Alcohol use: Yes  ?  Comment: Occasionaly/Once or twice a week (Beer)  ?  ? ?Family History:   ?The patient's family history includes Coronary artery disease in her father and mother; Stroke in her father. ? ?ROS:  ?Please see the history of  present illness.  ?All other ROS reviewed and negative.    ? ?Physical Exam/Data:  ? ?Vitals:  ? 06/15/21 1440 06/15/21 1500 06/15/21 1530 06/15/21 1600  ?BP: 133/85 133/88 129/77 133/80  ?Pulse: 77 72 73 73  ?Resp: '16 17 18 19  '$ ?Temp:      ?TempSrc:      ?SpO2: 99% 98% 97% 98%  ?Weight:      ?Height:      ? ?No intake or output data in the 24 hours ending 06/15/21 1640 ?Filed Weights  ? 06/15/21 1238  ?Weight: 93.9 kg  ? ?Body mass index is 36.67 kg/m?.  ? ?Gen: Patient appears comfortable at rest. ?HEENT: Conjunctiva and lids normal, oropharynx clear. ?Neck: Supple, no elevated JVP or carotid bruits, no thyromegaly. ?Lungs: Clear to auscultation, nonlabored breathing at rest. ?Cardiac: Regular rate and rhythm, no S3 or significant systolic murmur, no pericardial rub. ?Abdomen: Soft, nontender, bowel sounds present, no guarding or rebound. ?Extremities: No pitting edema, distal pulses 2+. ?Skin: Warm and dry. ?Musculoskeletal: No kyphosis. ?Neuropsychiatric: Alert and oriented x3, affect grossly appropriate. ? ?EKG:  An ECG dated 06/15/2021 was personally reviewed today and demonstrated:  Sinus rhythm with diffuse nonspecific T wave changes. ? ?Telemetry:  I personally reviewed telemetry which shows sinus rhythm. ? ?Relevant CV Studies: ? ?Cardiac catheterization November 2012: ?Coronary angiography:  ?Coronary dominance: Right ?  ?Left mainstem:   Normal ?  ?Left anterior descending (LAD):   Wraps the apex and normal throughout its course. First diagonal small normal. Second diagonal small normal. ?  ?Left circumflex (LCx):  AV groove normal. Small ramus intermediate normal. Large first obtuse marginal normal. ?  ?Right coronary artery (RCA):  Dominant but somewhat small vessel normal throughout its course. Small PDA normal. ?  ?Left ventriculography: Left ventricular systolic function is normal, LVEF is estimated at 55-65%, there is no significant mitral regurgitation  ?  ?Final Conclusions:  Normal coronary  arteries. Well-preserved ejection fraction. ? ?Laboratory Data: ? ?Chemistry ?Recent Labs  ?Lab 06/15/21 ?1320  ?NA 136  ?K 3.8  ?CL 102  ?CO2 26  ?GLUCOSE 104*  ?BUN 14  ?CREATININE 0.73  ?CALCIUM 9.3  ?GFRNONAA >60  ?ANIONGAP 8  ?  ?No results for input(s): PROT, ALBUMIN, AST, ALT, ALKPHOS, BILITOT in the last 168 hours. ?Hematology ?Recent Labs  ?Lab 06/15/21 ?1320  ?WBC 12.5*  ?RBC 4.17  ?HGB 12.6  ?HCT 38.5  ?MCV 92.3  ?MCH 30.2  ?MCHC 32.7  ?RDW 14.1  ?PLT 355  ? ?Cardiac Enzymes ?Recent Labs  ?Lab 06/15/21 ?1320 06/15/21 ?1444  ?TROPONINIHS <2 2  ? ? ?Radiology/Studies:  ?DG Chest 2 View ? ?Result Date: 06/15/2021 ?CLINICAL DATA:  Chest pain EXAM: CHEST - 2 VIEW COMPARISON:  2017 FINDINGS: The heart size and mediastinal contours are within normal limits. Both lungs are clear. No pleural effusion or pneumothorax. The visualized skeletal structures are unremarkable.  IMPRESSION: No acute process in the chest. Electronically Signed   By: Macy Mis M.D.   On: 06/15/2021 13:54  ? ?CT Angio Chest PE W and/or Wo Contrast ? ?Result Date: 06/15/2021 ?CLINICAL DATA:  Chest pain and shortness of breath since this morning EXAM: CT ANGIOGRAPHY CHEST WITH CONTRAST TECHNIQUE: Multidetector CT imaging of the chest was performed using the standard protocol during bolus administration of intravenous contrast. Multiplanar CT image reconstructions and MIPs were obtained to evaluate the vascular anatomy. RADIATION DOSE REDUCTION: This exam was performed according to the departmental dose-optimization program which includes automated exposure control, adjustment of the mA and/or kV according to patient size and/or use of iterative reconstruction technique. CONTRAST:  33m OMNIPAQUE IOHEXOL 350 MG/ML SOLN COMPARISON:  06/15/2021 chest x-ray FINDINGS: Cardiovascular: Pulmonary arteries are normal in caliber and appear patent. No significant filling defect or pulmonary embolus by CTA. Intact thoracic aorta with trace  atherosclerotic change. Patent 3 vessel arch anatomy. No aneurysm or dissection. No mediastinal hemorrhage or hematoma. Normal heart size. No pericardial effusion. Central venous structures are patent.  No veno-occlusive find

## 2021-06-15 NOTE — ED Notes (Signed)
Lab called regarding admission add-ons.   ?

## 2021-06-15 NOTE — ED Triage Notes (Signed)
Reports cp that started last night.  Reports passed out at work last night.  Woke up this morning with worsening cp/  pt is alert and amublatory to triage.  [facility] this [facility] for same.  Was seen here for further evaluation.  Reports cp is tight and and feels like someone is squeezing here.  Reports sob with exertion.   ?

## 2021-06-15 NOTE — ED Notes (Signed)
Pt c/o intermittent chest pain x a couple weeks.  Pt reports having to take Nitro on a couple of occasions.  Pt reports a syncopal episode while at work last night.  Sts she has been increasingly fatigued x a while.    ?

## 2021-06-16 ENCOUNTER — Inpatient Hospital Stay
Admission: AD | Admit: 2021-06-16 | Payer: BC Managed Care – PPO | Source: Other Acute Inpatient Hospital | Admitting: Cardiovascular Disease

## 2021-06-16 ENCOUNTER — Encounter (HOSPITAL_COMMUNITY)
Admission: EM | Disposition: A | Discharge status: Home / Self Care | Payer: Self-pay | Source: Home / Self Care | Attending: Emergency Medicine

## 2021-06-16 ENCOUNTER — Other Ambulatory Visit (HOSPITAL_COMMUNITY): Payer: BC Managed Care – PPO

## 2021-06-16 ENCOUNTER — Other Ambulatory Visit (HOSPITAL_COMMUNITY): Payer: Self-pay | Admitting: *Deleted

## 2021-06-16 ENCOUNTER — Encounter (HOSPITAL_COMMUNITY): Payer: Self-pay | Admitting: Internal Medicine

## 2021-06-16 ENCOUNTER — Observation Stay (HOSPITAL_BASED_OUTPATIENT_CLINIC_OR_DEPARTMENT_OTHER): Payer: BC Managed Care – PPO

## 2021-06-16 DIAGNOSIS — R079 Chest pain, unspecified: Secondary | ICD-10-CM | POA: Diagnosis not present

## 2021-06-16 DIAGNOSIS — E1169 Type 2 diabetes mellitus with other specified complication: Secondary | ICD-10-CM | POA: Diagnosis not present

## 2021-06-16 DIAGNOSIS — F32A Depression, unspecified: Secondary | ICD-10-CM | POA: Diagnosis not present

## 2021-06-16 DIAGNOSIS — E119 Type 2 diabetes mellitus without complications: Secondary | ICD-10-CM

## 2021-06-16 DIAGNOSIS — I10 Essential (primary) hypertension: Secondary | ICD-10-CM | POA: Diagnosis not present

## 2021-06-16 DIAGNOSIS — I200 Unstable angina: Secondary | ICD-10-CM

## 2021-06-16 DIAGNOSIS — K219 Gastro-esophageal reflux disease without esophagitis: Secondary | ICD-10-CM | POA: Diagnosis not present

## 2021-06-16 DIAGNOSIS — E66812 Obesity, class 2: Secondary | ICD-10-CM | POA: Diagnosis present

## 2021-06-16 DIAGNOSIS — R55 Syncope and collapse: Secondary | ICD-10-CM | POA: Diagnosis not present

## 2021-06-16 DIAGNOSIS — E782 Mixed hyperlipidemia: Secondary | ICD-10-CM

## 2021-06-16 DIAGNOSIS — E669 Obesity, unspecified: Secondary | ICD-10-CM

## 2021-06-16 HISTORY — PX: LEFT HEART CATH AND CORONARY ANGIOGRAPHY: CATH118249

## 2021-06-16 LAB — LIPID PANEL
Cholesterol: 229 mg/dL — ABNORMAL HIGH (ref 0–200)
HDL: 55 mg/dL (ref >40)
LDL Cholesterol: 136 mg/dL — ABNORMAL HIGH (ref 0–99)
Total CHOL/HDL Ratio: 4.2 RATIO
Triglycerides: 191 mg/dL — ABNORMAL HIGH (ref <150)
VLDL: 38 mg/dL (ref 0–40)

## 2021-06-16 LAB — BASIC METABOLIC PANEL
Anion gap: 7 (ref 5–15)
BUN: 16 mg/dL (ref 6–20)
CO2: 30 mmol/L (ref 22–32)
Calcium: 9.3 mg/dL (ref 8.9–10.3)
Chloride: 102 mmol/L (ref 98–111)
Creatinine, Ser: 0.85 mg/dL (ref 0.44–1.00)
GFR, Estimated: >60 mL/min (ref >60)
Glucose, Bld: 126 mg/dL — ABNORMAL HIGH (ref 70–99)
Potassium: 4.4 mmol/L (ref 3.5–5.1)
Sodium: 139 mmol/L (ref 135–145)

## 2021-06-16 LAB — CBC
HCT: 39 % (ref 36.0–46.0)
Hemoglobin: 12.1 g/dL (ref 12.0–15.0)
MCH: 29.5 pg (ref 26.0–34.0)
MCHC: 31 g/dL (ref 30.0–36.0)
MCV: 95.1 fL (ref 80.0–100.0)
Platelets: 328 10*3/uL (ref 150–400)
RBC: 4.1 MIL/uL (ref 3.87–5.11)
RDW: 14.1 % (ref 11.5–15.5)
WBC: 11 10*3/uL — ABNORMAL HIGH (ref 4.0–10.5)
nRBC: 0 % (ref 0.0–0.2)

## 2021-06-16 LAB — ECHOCARDIOGRAM COMPLETE
Area-P 1/2: 3.48 cm2
Calc EF: 60.7 %
Height: 63 in
S' Lateral: 3.5 cm
Single Plane A2C EF: 56.2 %
Single Plane A4C EF: 63.8 %
Weight: 3308.66 oz

## 2021-06-16 LAB — HEMOGLOBIN A1C
Hgb A1c MFr Bld: 6.8 % — ABNORMAL HIGH (ref 4.8–5.6)
Mean Plasma Glucose: 148.46 mg/dL

## 2021-06-16 LAB — HIV ANTIBODY (ROUTINE TESTING W REFLEX): HIV Screen 4th Generation wRfx: NONREACTIVE

## 2021-06-16 LAB — GLUCOSE, CAPILLARY
Glucose-Capillary: 129 mg/dL — ABNORMAL HIGH (ref 70–99)
Glucose-Capillary: 132 mg/dL — ABNORMAL HIGH (ref 70–99)

## 2021-06-16 SURGERY — LEFT HEART CATH AND CORONARY ANGIOGRAPHY
Anesthesia: LOCAL

## 2021-06-16 MED ORDER — HEPARIN SODIUM (PORCINE) 1000 UNIT/ML IJ SOLN
INTRAMUSCULAR | Status: DC | PRN
  Administered 2021-06-16: 5000 [IU] via INTRAVENOUS

## 2021-06-16 MED ORDER — ASPIRIN 81 MG PO CHEW: 81.0000 mg | CHEWABLE_TABLET | ORAL | Status: DC

## 2021-06-16 MED ORDER — LIDOCAINE HCL (PF) 1 % IJ SOLN
INTRAMUSCULAR | Status: AC
  Filled 2021-06-16: qty 30

## 2021-06-16 MED ORDER — ASPIRIN 81 MG PO CHEW
CHEWABLE_TABLET | ORAL | Status: AC
  Filled 2021-06-16: qty 1

## 2021-06-16 MED ORDER — HEPARIN (PORCINE) IN NACL 1000-0.9 UT/500ML-% IV SOLN
INTRAVENOUS | Status: DC | PRN
  Administered 2021-06-16 (×2): 500 mL

## 2021-06-16 MED ORDER — ASPIRIN 81 MG PO CHEW
81.0000 mg | CHEWABLE_TABLET | ORAL | Status: DC
  Administered 2021-06-16: 81 mg via ORAL

## 2021-06-16 MED ORDER — ASPIRIN EC 81 MG PO TBEC: 81.0000 mg | DELAYED_RELEASE_TABLET | Freq: Every day | ORAL | Status: DC

## 2021-06-16 MED ORDER — MIDAZOLAM HCL 2 MG/2ML IJ SOLN
INTRAMUSCULAR | Status: DC | PRN
  Administered 2021-06-16 (×3): 1 mg via INTRAVENOUS

## 2021-06-16 MED ORDER — LIDOCAINE HCL (PF) 1 % IJ SOLN
INTRAMUSCULAR | Status: DC | PRN
  Administered 2021-06-16: 2 mL

## 2021-06-16 MED ORDER — ONDANSETRON HCL 4 MG/2ML IJ SOLN: 4.0000 mg | Freq: Four times a day (QID) | INTRAMUSCULAR | Status: DC | PRN

## 2021-06-16 MED ORDER — PERFLUTREN LIPID MICROSPHERE
1.0000 mL | INTRAVENOUS | Status: DC | PRN
  Administered 2021-06-16: 2 mL via INTRAVENOUS
  Filled 2021-06-16: qty 10

## 2021-06-16 MED ORDER — ACETAMINOPHEN 325 MG PO TABS: 650.0000 mg | ORAL_TABLET | ORAL | Status: DC | PRN

## 2021-06-16 MED ORDER — SODIUM CHLORIDE 0.9% FLUSH: 3.0000 mL | Freq: Two times a day (BID) | INTRAVENOUS | Status: DC

## 2021-06-16 MED ORDER — SODIUM CHLORIDE 0.9 % WEIGHT BASED INFUSION: 3.0000 mL/kg/h | INTRAVENOUS | Status: DC

## 2021-06-16 MED ORDER — HYDRALAZINE HCL 20 MG/ML IJ SOLN: 10.0000 mg | INTRAMUSCULAR | Status: DC | PRN

## 2021-06-16 MED ORDER — SODIUM CHLORIDE 0.9 % WEIGHT BASED INFUSION: 1.0000 mL/kg/h | INTRAVENOUS | Status: DC

## 2021-06-16 MED ORDER — ROSUVASTATIN CALCIUM 20 MG PO TABS
40.0000 mg | ORAL_TABLET | Freq: Every day | ORAL | Status: DC
Start: 2021-06-16 — End: 2021-06-16

## 2021-06-16 MED ORDER — HEPARIN SODIUM (PORCINE) 1000 UNIT/ML IJ SOLN
INTRAMUSCULAR | Status: AC
  Filled 2021-06-16: qty 10

## 2021-06-16 MED ORDER — IOHEXOL 350 MG/ML SOLN
INTRAVENOUS | Status: DC | PRN
  Administered 2021-06-16: 30 mL

## 2021-06-16 MED ORDER — HEPARIN (PORCINE) IN NACL 1000-0.9 UT/500ML-% IV SOLN
INTRAVENOUS | Status: AC
  Filled 2021-06-16: qty 1000

## 2021-06-16 MED ORDER — MIDAZOLAM HCL 2 MG/2ML IJ SOLN
INTRAMUSCULAR | Status: AC
  Filled 2021-06-16: qty 2

## 2021-06-16 MED ORDER — SODIUM CHLORIDE 0.9 % IV SOLN: INTRAVENOUS | Status: DC

## 2021-06-16 MED ORDER — SODIUM CHLORIDE 0.9 % WEIGHT BASED INFUSION
1.0000 mL/kg/h | INTRAVENOUS | Status: DC
  Administered 2021-06-16: 1 mL/kg/h via INTRAVENOUS

## 2021-06-16 MED ORDER — VERAPAMIL HCL 2.5 MG/ML IV SOLN
INTRAVENOUS | Status: DC | PRN
  Administered 2021-06-16: 5 mL via INTRA_ARTERIAL

## 2021-06-16 MED ORDER — SODIUM CHLORIDE 0.9 % IV SOLN
250.0000 mL | INTRAVENOUS | Status: DC | PRN
Start: 2021-06-16 — End: 2021-06-16

## 2021-06-16 MED ORDER — FENTANYL CITRATE (PF) 100 MCG/2ML IJ SOLN
INTRAMUSCULAR | Status: DC | PRN
  Administered 2021-06-16 (×3): 25 ug via INTRAVENOUS

## 2021-06-16 MED ORDER — LABETALOL HCL 5 MG/ML IV SOLN: 10.0000 mg | INTRAVENOUS | Status: DC | PRN

## 2021-06-16 MED ORDER — ROSUVASTATIN CALCIUM 40 MG PO TABS
40.0000 mg | ORAL_TABLET | Freq: Every day | ORAL | 0 refills | Status: AC
Start: 2021-06-16 — End: ?

## 2021-06-16 MED ORDER — SODIUM CHLORIDE 0.9% FLUSH: 3.0000 mL | INTRAVENOUS | Status: DC | PRN

## 2021-06-16 MED ORDER — VERAPAMIL HCL 2.5 MG/ML IV SOLN
INTRAVENOUS | Status: AC
  Filled 2021-06-16: qty 2

## 2021-06-16 MED ORDER — FENTANYL CITRATE (PF) 100 MCG/2ML IJ SOLN
INTRAMUSCULAR | Status: AC
  Filled 2021-06-16: qty 2

## 2021-06-16 SURGICAL SUPPLY — 11 items
CATH DIAG 6FR JR4 (CATHETERS) ×1 IMPLANT
CATH INFINITI 6F FL3.5 (CATHETERS) ×1 IMPLANT
DEVICE RAD COMP TR BAND LRG (VASCULAR PRODUCTS) ×1 IMPLANT
GLIDESHEATH SLEND SS 6F .021 (SHEATH) ×1 IMPLANT
GUIDEWIRE INQWIRE 1.5J.035X260 (WIRE) IMPLANT
INQWIRE 1.5J .035X260CM (WIRE) ×2
KIT HEART LEFT (KITS) ×2 IMPLANT
PACK CARDIAC CATHETERIZATION (CUSTOM PROCEDURE TRAY) ×2 IMPLANT
SHEATH PROBE COVER 6X72 (BAG) ×1 IMPLANT
TRANSDUCER W/STOPCOCK (MISCELLANEOUS) ×2 IMPLANT
TUBING CIL FLEX 10 FLL-RA (TUBING) ×2 IMPLANT

## 2021-06-16 NOTE — Interval H&P Note (Signed)
History and Physical Interval Note: ? ?06/16/2021 ?9:43 AM ? ?Tiffany Barker  has presented today for surgery, with the diagnosis of unstable angina.  The various methods of treatment have been discussed with the patient and family. After consideration of risks, benefits and other options for treatment, the patient has consented to  Procedure(s): ?LEFT HEART CATH AND CORONARY ANGIOGRAPHY (N/A) as a surgical intervention.  The patient's history has been reviewed, patient examined, no change in status, stable for surgery.  I have reviewed the patient's chart and labs.  Questions were answered to the patient's satisfaction.   ? ?Cath Lab Visit (complete for each Cath Lab visit) ? ?Clinical Evaluation Leading to the Procedure:  ? ?ACS: No. ? ?Non-ACS:   ? ?Anginal Classification: CCS IV ? ?Anti-ischemic medical therapy: Maximal Therapy (2 or more classes of medications) ? ?Non-Invasive Test Results: No non-invasive testing performed ? ?Prior CABG: No previous CABG ? ? ? ? ? ? ? ?[place] ? ? ?

## 2021-06-16 NOTE — Assessment & Plan Note (Signed)
-  With concern for angina ?-After discussing options for evaluation with cardiology service decision has been made to proceed cardiac catheterization. ?-Heart score of 4. ?-Continue aspirin, statin and Lopressor ?-Patient will be transferred to [facility] for heart catheterization later today. ?

## 2021-06-16 NOTE — Assessment & Plan Note (Signed)
Continue PPI ?

## 2021-06-16 NOTE — Progress Notes (Signed)
?  Echocardiogram ?2D Echocardiogram has been performed. ? ?Tiffany Barker R ?06/16/2021, 1:25 PM ?

## 2021-06-16 NOTE — Discharge Summary (Addendum)
?Discharge Summary  ?  ?Patient ID: Alfonzo Feller ?MRN: [id number]; DOB: June 10, 1960 ? ?Admit date: 06/15/2021 ?Discharge date: 06/16/2021 ? ?PCP:  Sofie Rower, PA-C ?  ?[facility] Providers ?Cardiologist:  Rozann Lesches, MD    ? ?Discharge Diagnoses  ?  ?Principal Problem: ?  Chest pain ?Active Problems: ?  Mixed hyperlipidemia ?  Essential hypertension, benign ?  Syncope ?  Gastroesophageal reflux disease ?  Controlled type 2 diabetes mellitus without complication, without long-term current use of insulin ([place]) ?  Depression ?  Obesity, Class II, BMI 35-39.9 ?  Unstable angina (HCC) ? ?Diagnostic Studies/Procedures  ?  ?Cath: 06/16/21 ? ?  LV end diastolic pressure is normal. ?  ?1.  Minimal luminal irregularities of LAD with no significant obstructive disease elsewhere. ?2.  LVEDP of 17 mmHg. ?  ?Recommendations: Medical therapy ?_____________ ?  ?History of Present Illness   ?  ?DEZYRE HOEFER is a 61 y.o. female with a history of essential hypertension, GERD, type 2 diabetes mellitus, normal coronary arteries in 2012, and family history of premature CAD who is being seen today for the evaluation of accelerating chest discomfort and recent episode of syncope at the request of Dr. Waldron Labs. ? ?Ms. Tschirhart presented to the ER referred after visit with her PCP.  She stated that over the last few months she has been having recurrent episodes of chest tightness and fatigue.  This has been notable with exertion and emotional stress.  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  About two weeks ago when she was at home she developed a severe episode of chest tightness that was prolonged and at rest. She went to bed and woke up the next day feeling very fatigued and not herself.  Since that time she had more exertional fatigue, continued to have exertional chest tightness, and the day prior while at work after lifting a heavy pain of glass and bending over, she stood back up  and had a brief syncopal event. ?  ?Previous cardiac work-up includes a cardiac catheterization in 2012 that showed normal coronary arteries.  She reported diagnosis of type 2 diabetes mellitus about a year ago. ?  ?Initial high-sensitivity troponin I level was normal and her ECG showed diffuse nonspecific T wave abnormalities. She was seen by Dr. Domenic Polite and Bernerd Pho with plans for transfer for [facility] for cardiac cath.  ? ?Hospital Course  ?   ?Chest Pain: Presented with worsening episodes of chest pressure and fatigue for the past several months with intermittent pain along her shoulder blades. Hs Troponin values have been negative but in reviewing options with Dr. Domenic Polite, it was decided a cardiac catheterization for definitive evaluation would be the best option given her multiple cardiac risk factors. Transferred to [facility] and underwent cardiac cath noted above with minimal luminal irregularities of LAD with no significant obstructive disease.  ?-- continue Lopressor '75mg'$  BID,  Crestor '40mg'$  daily.  ?  ?HTN: stable blood pressure ?-- Continue Lopressor '75mg'$  BID ? ?  ?HLD: FLP shows total cholesterol of 229, HDL 55, Triglycerides 191 and LDL 136.  ?-- added Crestor '40mg'$  daily ?-- Will need repeat FLP and LFT's in 6-8 weeks.  ?  ?Type 2 DM: Hgb A1c at 6.8.  ?-- Trulicity and Metformin as an outpatient, resume at discharge ? ?Did the patient have an acute coronary syndrome (MI, NSTEMI, STEMI, etc) this admission?:  No                               ?  Did the patient have a percutaneous coronary intervention (stent / angioplasty)?:  No.   ?_____________ ? ?Discharge Vitals ?Blood pressure 127/80, pulse 76, temperature 97.7 ?F (36.5 ?C), resp. rate 17, height '5\' 3"'$  (1.6 m), weight 93.8 kg, last menstrual period 07/10/2013, SpO2 98 %.  ?Filed Weights  ? 06/15/21 1238 06/15/21 1720  ?Weight: 93.9 kg 93.8 kg  ? ? ?Labs & Radiologic Studies  ?  ?CBC ?Recent Labs  ?  06/15/21 ?1320 06/16/21 ?0400  ?WBC 12.5* 11.0*   ?HGB 12.6 12.1  ?HCT 38.5 39.0  ?MCV 92.3 95.1  ?PLT 355 328  ? ?Basic Metabolic Panel ?Recent Labs  ?  06/15/21 ?1320 06/16/21 ?0400  ?NA 136 139  ?K 3.8 4.4  ?CL 102 102  ?CO2 26 30  ?GLUCOSE 104* 126*  ?BUN 14 16  ?CREATININE 0.73 0.85  ?CALCIUM 9.3 9.3  ? ?Liver Function Tests ?No results for input(s): AST, ALT, ALKPHOS, BILITOT, PROT, ALBUMIN in the last 72 hours. ?No results for input(s): LIPASE, AMYLASE in the last 72 hours. ?High Sensitivity Troponin:   ?Recent Labs  ?Lab 06/15/21 ?1320 06/15/21 ?1444  ?TROPONINIHS <2 2  ?  ?BNP ?Invalid input(s): POCBNP ?D-Dimer ?No results for input(s): DDIMER in the last 72 hours. ?Hemoglobin A1C ?Recent Labs  ?  06/16/21 ?0400  ?HGBA1C 6.8*  ? ?Fasting Lipid Panel ?Recent Labs  ?  06/16/21 ?0400  ?CHOL 229*  ?HDL 55  ?LDLCALC 136*  ?TRIG 191*  ?CHOLHDL 4.2  ? ?Thyroid Function Tests ?No results for input(s): TSH, T4TOTAL, T3FREE, THYROIDAB in the last 72 hours. ? ?Invalid input(s): FREET3 ?_____________  ?DG Chest 2 View ? ?Result Date: 06/15/2021 ?CLINICAL DATA:  Chest pain EXAM: CHEST - 2 VIEW COMPARISON:  2017 FINDINGS: The heart size and mediastinal contours are within normal limits. Both lungs are clear. No pleural effusion or pneumothorax. The visualized skeletal structures are unremarkable. IMPRESSION: No acute process in the chest. Electronically Signed   By: Macy Mis M.D.   On: 06/15/2021 13:54  ? ?CT Angio Chest PE W and/or Wo Contrast ? ?Result Date: 06/15/2021 ?CLINICAL DATA:  Chest pain and shortness of breath since this morning EXAM: CT ANGIOGRAPHY CHEST WITH CONTRAST TECHNIQUE: Multidetector CT imaging of the chest was performed using the standard protocol during bolus administration of intravenous contrast. Multiplanar CT image reconstructions and MIPs were obtained to evaluate the vascular anatomy. RADIATION DOSE REDUCTION: This exam was performed according to the departmental dose-optimization program which includes automated exposure control,  adjustment of the mA and/or kV according to patient size and/or use of iterative reconstruction technique. CONTRAST:  53m OMNIPAQUE IOHEXOL 350 MG/ML SOLN COMPARISON:  06/15/2021 chest x-ray FINDINGS: Cardiovascular: Pulmonary arteries are normal in caliber and appear patent. No significant filling defect or pulmonary embolus by CTA. Intact thoracic aorta with trace atherosclerotic change. Patent 3 vessel arch anatomy. No aneurysm or dissection. No mediastinal hemorrhage or hematoma. Normal heart size. No pericardial effusion. Central venous structures are patent.  No veno-occlusive finding. Mediastinum/Nodes: No enlarged mediastinal, hilar, or axillary lymph nodes. Thyroid gland, trachea, and esophagus demonstrate no significant findings. Lungs/Pleura: Lungs are clear. No pleural effusion or pneumothorax. Slight nonspecific pleural fatty proliferation more so on the right than the left. Otherwise no other pleural abnormality, effusion, or pneumothorax. Upper Abdomen: Mild hepatic steatosis noted. No acute upper abdominal finding. Mesenteric and renal vascular visualized appear patent. Musculoskeletal: Right thoracic upper back subcutaneous well-circumscribed nodule extending to the skin surface measures 12 mm, image 21/4 suspect sebaceous  cyst. No other chest wall soft tissue abnormality or asymmetry. No acute osseous finding. Review of the MIP images confirms the above findings. IMPRESSION: Negative for significant acute pulmonary embolus by CTA. No other acute intrathoracic finding. Aortic Atherosclerosis (ICD10-I70.0). Electronically Signed   By: Jerilynn Mages.  Shick M.D.   On: 06/15/2021 14:40  ? ?CARDIAC CATHETERIZATION ? ?Result Date: 06/16/2021 ?  LV end diastolic pressure is normal. 1.  Minimal luminal irregularities of LAD with no significant obstructive disease elsewhere. 2.  LVEDP of 17 mmHg. Recommendations: Medical therapy  ? ?ECHOCARDIOGRAM COMPLETE ? ?Result Date: 06/16/2021 ?   ECHOCARDIOGRAM REPORT   Patient  Name:   MORGHAN KESTER Date of Exam: 06/16/2021 Medical Rec #:  [id number]        Height:       63.0 in Accession #:    [id number]       Weight:       206.8 lb Date of Birth:  Jan 17, 1961       BSA:          1.

## 2021-06-16 NOTE — Assessment & Plan Note (Signed)
-   Appears to be vasovagal in nature ?-CT angiogram negative for PE. ?-Follow results of 2D echo ?-Patient will have a cardiac catheterization later today as per discussion with cardiology service. ?-Continue telemetry monitoring and maintain adequate hydration.Marland Kitchen ?

## 2021-06-16 NOTE — Discharge Instructions (Addendum)
Restart metformin on 06/18/21 ?

## 2021-06-16 NOTE — Assessment & Plan Note (Signed)
-   LDL 136 ?-Crestor has been started. ?-Heart healthy diet discussed with patient. ?

## 2021-06-16 NOTE — Assessment & Plan Note (Signed)
-   Low calorie diet, portion control and increase physical activity discussed with patient. ?-Body mass index is 36.63 kg/m?Marland Kitchen ? ?

## 2021-06-16 NOTE — Progress Notes (Signed)
?Progress Note ? ? ?Patient: Tiffany Barker:[id number] DOB: 1960/06/28 DOA: 06/15/2021     0 ?DOS: the patient was seen and examined on 06/16/2021 ?  ?Brief hospital course: ?As per H&P written by Dr. Waldron Labs on 06/15/2021. ? Tiffany Barker  is a 61 y.o. female, with past medical history of hypertension, hyperlipidemia, patient presents to ED secondary to complaints of chest pain, patient reports chest pain has been going intermittently for the last month, patient with history of chest pain in 2012, work-up including Lexiscan which was unremarkable, she was kept empirically on aspirin, aspirin was continued 62-monthago due to tinnitus, without much improvement of her tinnitus, she has a follow-up with ENT next month, patient reports progressive chest pain over the last month, happening with activity, midsternal, described as tightness, radiating to the neck, reports she passed out 1 time couple weeks ago, reports another episode of passing out yesterday, reports she was lifting a heavy piece of glass at her work, she was able to put it down, she then she syncopized at work, reported dyspnea with activity, chest pain currently is more frequent, and with increased intensity, and comes with [place] activity, reports she is on sublingual nitro at home with some help recently. ?-In ED her troponins was negative, CTA chest was obtained with no evidence of PE, she is currently chest pain-free after receiving nitro, she is currently chest pain-free, [facility] hospitalist consulted to admit. ? ?Assessment and Plan: ?* Chest pain ?-With concern for angina ?-After discussing options for evaluation with cardiology service decision has been made to proceed cardiac catheterization. ?-Heart score of 4. ?-Continue aspirin, statin and Lopressor ?[facility] ? ?Obesity, Class II, BMI 35-39.9 ?- Low calorie diet, portion control and increase physical  activity discussed with patient. ?-Body mass index is 36.63 kg/m?. ? ? ?Depression ?- Continue Cymbalta ?-No suicidal ideation or hallucinations. ? ?Controlled type 2 diabetes mellitus without complication, without long-term current use of insulin (HCC) ?- A1c 6.8 ?-At home patient uses metformin and Trulicity ?-Continue to follow CBGs and if needed will use sensitive sliding scale while inpatient. ?-Resume home hypoglycemic regimen after discharge. ?-Patient currently n.p.o. ? ?Gastroesophageal reflux disease ?- Continue PPI. ? ?Syncope ?- Appears to be vasovagal in nature ?-CT angiogram negative for PE. ?-Follow results of 2D echo ?-Patient will have a cardiac catheterization later today as per discussion with cardiology service. ?-Continue telemetry monitoring and maintain adequate hydration.. ? ?Essential hypertension, benign ?- Stable overall ?-Continue current antihypertensive agent ?-Follow vital signs. ? ?Mixed hyperlipidemia ?- LDL 136 ?-Crestor has been started. ?-Heart healthy diet discussed with patient. ? ? ?Subjective:  ?Afebrile, no nausea, no vomiting, good oxygen saturation on room air.  Reports no chest pain currently.  Patient is anxious about catheterization. ? ?Physical Exam: ?Vitals:  ? 06/15/21 1643 06/15/21 1720 06/15/21 2122 06/16/21 0542  ?BP:  130/86 (!) 128/94 (!) 142/90  ?Pulse:  76 83 68  ?Resp:  '18 18 18  '$ ?Temp: 97.7 ?F (36.5 ?C) 98 ?F (36.7 ?C) 98 ?F (36.7 ?C) 97.7 ?F (36.5 ?C)  ?TempSrc: Oral Oral    ?SpO2:  99% 100% 100%  ?Weight:  93.8 kg    ?Height:  '5\' 3"'$  (1.6 m)    ? ?General exam: Alert, awake, oriented x 3; reporting mild discomfort in between her shoulder blades (she thinks is because of the bed). ?Respiratory system: Clear to auscultation. Respiratory effort normal.  No using accessory  muscles. ?Cardiovascular system:RRR. No rubs or gallops; no JVD. ?Gastrointestinal system: Abdomen is nondistended, soft and nontender. No organomegaly or masses felt. Normal bowel sounds  heard. ?Central nervous system: Alert and oriented. No focal neurological deficits. ?Extremities: No cyanosis or clubbing.  No edema. ?Skin: No petechiae. ?Psychiatry: Judgement and insight appear normal. Mood & affect appropriate.  ? ?Data Reviewed: ?Hemoglobin A1c 6.8 and ?Lipid panel demonstrating an LDL of 136, HDL 55, total cholesterol 229 and triglycerides 191. ?Basic metabolic panel with a sodium of 139, potassium 4.4, BUN 16 and creatinine 0.85. ?BC demonstrating a WBC of 11.0, hemoglobin 12.1 and platelet count 328 K. ? ?Family Communication: No family at bedside. ? ?Disposition: ?Status is: Observation ? ? Planned Discharge Destination: Home ? ? ?Author: ?Barton Dubois, MD ?06/16/2021 9:07 AM ? ?For on call review [email or web address].  ?

## 2021-06-16 NOTE — Assessment & Plan Note (Signed)
-   A1c 6.8 ?-At home patient uses metformin and Trulicity ?-Continue to follow CBGs and if needed will use sensitive sliding scale while inpatient. ?-Resume home hypoglycemic regimen after discharge. ?-Patient currently n.p.o. ?

## 2021-06-16 NOTE — Assessment & Plan Note (Signed)
-   Stable overall ?-Continue current antihypertensive agent ?-Follow vital signs. ?

## 2021-06-16 NOTE — Progress Notes (Addendum)
? ?Progress Note ? ?Patient Name: Tiffany Barker ?Date of Encounter: 06/16/2021 ? ?[facility] Cardiologist: Rozann Lesches, MD  ? ?Subjective  ? ?Anxious about her catheterization today. Denies any specific chest pain but reports a fullness along her upper chest cavity and intermittent discomfort along her shoulder blades. Did not sleep well last night.  ? ?Inpatient Medications  ?  ?Scheduled Meds: ? aspirin  81 mg Oral Pre-Cath  ? aspirin  325 mg Oral Daily  ? DULoxetine  60 mg Oral Daily  ? heparin  5,000 Units Subcutaneous Q8H  ? metoprolol tartrate  75 mg Oral BID  ? nitroGLYCERIN  0.4 mg Sublingual UD  ? pantoprazole  40 mg Oral Daily  ? sodium chloride flush  3 mL Intravenous Q12H  ? ?Continuous Infusions: ? sodium chloride 50 mL/hr at 06/16/21 0451  ? sodium chloride    ? ?PRN Meds: ?acetaminophen, ondansetron (ZOFRAN) IV  ? ?Vital Signs  ?  ?Vitals:  ? 06/15/21 1643 06/15/21 1720 06/15/21 2122 06/16/21 0542  ?BP:  130/86 (!) 128/94 (!) 142/90  ?Pulse:  76 83 68  ?Resp:  '18 18 18  '$ ?Temp: 97.7 ?F (36.5 ?C) 98 ?F (36.7 ?C) 98 ?F (36.7 ?C) 97.7 ?F (36.5 ?C)  ?TempSrc: Oral Oral    ?SpO2:  99% 100% 100%  ?Weight:  93.8 kg    ?Height:  '5\' 3"'$  (1.6 m)    ? ? ?Intake/Output Summary (Last 24 hours) at 06/16/2021 0744 ?Last data filed at 06/16/2021 0451 ?Gross per 24 hour  ?Intake 563.41 ml  ?Output --  ?Net 563.41 ml  ? ? ?  06/15/2021  ?  5:20 PM 06/15/2021  ? 12:38 PM 09/03/2018  ?  8:45 AM  ?Last 3 Weights  ?Weight (lbs) 206 lb 12.7 oz 207 lb 212 lb  ?Weight (kg) 93.8 kg 93.895 kg 96.163 kg  ?   ? ?Telemetry  ?  ?NSR, HR in 70's to 80's. Rare PVC's.  - Personally Reviewed ? ?ECG  ?  ?NSR, HR 70 with nonspecific ST abnormalities.  - Personally Reviewed ? ?Physical Exam  ? ?GEN: Appearing in no acute distress.   ?Neck: No JVD ?Cardiac: RRR, no murmurs, rubs, or gallops.  ?Respiratory: Clear to auscultation bilaterally without wheezing or rales. ?GI: Soft, nontender, non-distended  ?MS: No pitting edema; No  deformity. ?Neuro:  Nonfocal  ?Psych: Normal affect  ? ?Labs  ?  ?High Sensitivity Troponin:   ?Recent Labs  ?Lab 06/15/21 ?1320 06/15/21 ?1444  ?TROPONINIHS <2 2  ?   ?Chemistry ?Recent Labs  ?Lab 06/15/21 ?1320 06/16/21 ?0400  ?NA 136 139  ?K 3.8 4.4  ?CL 102 102  ?CO2 26 30  ?GLUCOSE 104* 126*  ?BUN 14 16  ?CREATININE 0.73 0.85  ?CALCIUM 9.3 9.3  ?GFRNONAA >60 >60  ?ANIONGAP 8 7  ?  ?Lipids  ?Recent Labs  ?Lab 06/16/21 ?0400  ?CHOL 229*  ?TRIG 191*  ?HDL 55  ?LDLCALC 136*  ?CHOLHDL 4.2  ?  ?Hematology ?Recent Labs  ?Lab 06/15/21 ?1320 06/16/21 ?0400  ?WBC 12.5* 11.0*  ?RBC 4.17 4.10  ?HGB 12.6 12.1  ?HCT 38.5 39.0  ?MCV 92.3 95.1  ?MCH 30.2 29.5  ?MCHC 32.7 31.0  ?RDW 14.1 14.1  ?PLT 355 328  ? ?Thyroid No results for input(s): TSH, FREET4 in the last 168 hours.  ?BNPNo results for input(s): BNP, PROBNP in the last 168 hours.  ?DDimer No results for input(s): DDIMER in the last 168 hours.  ? ?Radiology  ?  ?  DG Chest 2 View ? ?Result Date: 06/15/2021 ?CLINICAL DATA:  Chest pain EXAM: CHEST - 2 VIEW COMPARISON:  2017 FINDINGS: The heart size and mediastinal contours are within normal limits. Both lungs are clear. No pleural effusion or pneumothorax. The visualized skeletal structures are unremarkable. IMPRESSION: No acute process in the chest. Electronically Signed   By: Macy Mis M.D.   On: 06/15/2021 13:54  ? ?CT Angio Chest PE W and/or Wo Contrast ? ?Result Date: 06/15/2021 ?CLINICAL DATA:  Chest pain and shortness of breath since this morning EXAM: CT ANGIOGRAPHY CHEST WITH CONTRAST TECHNIQUE: Multidetector CT imaging of the chest was performed using the standard protocol during bolus administration of intravenous contrast. Multiplanar CT image reconstructions and MIPs were obtained to evaluate the vascular anatomy. RADIATION DOSE REDUCTION: This exam was performed according to the departmental dose-optimization program which includes automated exposure control, adjustment of the mA and/or kV according to  patient size and/or use of iterative reconstruction technique. CONTRAST:  51m OMNIPAQUE IOHEXOL 350 MG/ML SOLN COMPARISON:  06/15/2021 chest x-ray FINDINGS: Cardiovascular: Pulmonary arteries are normal in caliber and appear patent. No significant filling defect or pulmonary embolus by CTA. Intact thoracic aorta with trace atherosclerotic change. Patent 3 vessel arch anatomy. No aneurysm or dissection. No mediastinal hemorrhage or hematoma. Normal heart size. No pericardial effusion. Central venous structures are patent.  No veno-occlusive finding. Mediastinum/Nodes: No enlarged mediastinal, hilar, or axillary lymph nodes. Thyroid gland, trachea, and esophagus demonstrate no significant findings. Lungs/Pleura: Lungs are clear. No pleural effusion or pneumothorax. Slight nonspecific pleural fatty proliferation more so on the right than the left. Otherwise no other pleural abnormality, effusion, or pneumothorax. Upper Abdomen: Mild hepatic steatosis noted. No acute upper abdominal finding. Mesenteric and renal vascular visualized appear patent. Musculoskeletal: Right thoracic upper back subcutaneous well-circumscribed nodule extending to the skin surface measures 12 mm, image 21/4 suspect sebaceous cyst. No other chest wall soft tissue abnormality or asymmetry. No acute osseous finding. Review of the MIP images confirms the above findings. IMPRESSION: Negative for significant acute pulmonary embolus by CTA. No other acute intrathoracic finding. Aortic Atherosclerosis (ICD10-I70.0). Electronically Signed   By: MJerilynn Mages  Shick M.D.   On: 06/15/2021 14:40   ? ?Cardiac Studies  ? ?Cardiac Catheterization: 12/2010 ?Coronary angiography:  ?Coronary dominance: Right ?  ?Left mainstem:   Normal ?  ?Left anterior descending (LAD):   Wraps the apex and normal throughout its course. First diagonal small normal. Second diagonal small normal. ?  ?Left circumflex (LCx):  AV groove normal. Small ramus intermediate normal. Large first  obtuse marginal normal. ?  ?Right coronary artery (RCA):  Dominant but somewhat small vessel normal throughout its course. Small PDA normal. ?  ?Left ventriculography: Left ventricular systolic function is normal, LVEF is estimated at 55-65%, there is no significant mitral regurgitation  ?  ?Final Conclusions:  Normal coronary arteries. Well-preserved ejection fraction. ?  ?Recommendations:   No further cardiac workup. The patient will follow with her primary physician for workup of nonanginal chest pain. ? ?Patient Profile  ?   ?61y.o. female w/ PMH of HTN, HLD, GERD, Type 2 DM, family history of premature CAD and normal cors by prior cath in 2012 who is currently admitted for evaluation of chest pain.  ? ?Assessment & Plan  ?  ?1. Chest Pain concerning for Unstable Angina ?- Presented with worsening episodes of chest pressure and fatigue for the past several months with intermittent pain along her shoulder blades.  ?- Hs Troponin values  have been negative but in reviewing options with Dr. Domenic Polite, it was decided a cardiac catheterization for definitive evaluation would be the best option given her multiple cardiac risk factors. Risks and benefits previously reviewed and she is scheduled for the procedure later this morning. Echocardiogram also pending.  ?- Continue ASA '81mg'$  daily and Lopressor '75mg'$  BID. Will add Crestor '40mg'$  daily.  ? ?2. HTN ?- Her BP has been variable from 123/84 - 142/90 since admission. Continue Lopressor '75mg'$  BID. Would add low-dose ARB if BP remains above goal given her Type 2 DM.  ? ?3. HLD ?- FLP shows total cholesterol of 229, HDL 55, Triglycerides 191 and LDL 136. Will add Crestor '40mg'$  daily. Will need repeat FLP and LFT's in 6-8 weeks.  ? ?4. Type 2 DM ?- Hgb A1c at 6.8. She is on Trulicity and Metformin as an outpatient. Metformin currently held given upcoming catheterization.  ? ?Disposition: She reports her roommate is out of town until later this evening so this may hinder  discharge following her catheterization if she does not require PCI. She also reports her car is here at [facility] and no one would be able to pick her up from [facility] today. Therefore, if discharged today would nee

## 2021-06-16 NOTE — Assessment & Plan Note (Signed)
-   Continue Cymbalta ?-No suicidal ideation or hallucinations. ?

## 2022-10-06 ENCOUNTER — Encounter (HOSPITAL_COMMUNITY): Payer: Self-pay | Admitting: Emergency Medicine

## 2022-10-06 ENCOUNTER — Other Ambulatory Visit: Payer: Self-pay

## 2022-10-06 ENCOUNTER — Emergency Department (HOSPITAL_COMMUNITY)
Admission: EM | Admit: 2022-10-06 | Discharge: 2022-10-06 | Disposition: A | Discharge status: Home / Self Care | Payer: BC Managed Care – PPO | Attending: Emergency Medicine | Admitting: Emergency Medicine

## 2022-10-06 ENCOUNTER — Emergency Department (HOSPITAL_COMMUNITY): Payer: BC Managed Care – PPO

## 2022-10-06 DIAGNOSIS — Z7984 Long term (current) use of oral hypoglycemic drugs: Secondary | ICD-10-CM | POA: Insufficient documentation

## 2022-10-06 DIAGNOSIS — R0789 Other chest pain: Secondary | ICD-10-CM | POA: Diagnosis not present

## 2022-10-06 DIAGNOSIS — I10 Essential (primary) hypertension: Secondary | ICD-10-CM | POA: Insufficient documentation

## 2022-10-06 DIAGNOSIS — E1165 Type 2 diabetes mellitus with hyperglycemia: Secondary | ICD-10-CM | POA: Diagnosis not present

## 2022-10-06 DIAGNOSIS — Z79899 Other long term (current) drug therapy: Secondary | ICD-10-CM | POA: Diagnosis not present

## 2022-10-06 DIAGNOSIS — H8110 Benign paroxysmal vertigo, unspecified ear: Secondary | ICD-10-CM | POA: Diagnosis not present

## 2022-10-06 DIAGNOSIS — D72829 Elevated white blood cell count, unspecified: Secondary | ICD-10-CM | POA: Insufficient documentation

## 2022-10-06 DIAGNOSIS — D649 Anemia, unspecified: Secondary | ICD-10-CM | POA: Insufficient documentation

## 2022-10-06 DIAGNOSIS — R079 Chest pain, unspecified: Secondary | ICD-10-CM

## 2022-10-06 LAB — CBC
HCT: 36.3 % (ref 36.0–46.0)
Hemoglobin: 11.9 g/dL — ABNORMAL LOW (ref 12.0–15.0)
MCH: 30.5 pg (ref 26.0–34.0)
MCHC: 32.8 g/dL (ref 30.0–36.0)
MCV: 93.1 fL (ref 80.0–100.0)
Platelets: 322 10*3/uL (ref 150–400)
RBC: 3.9 MIL/uL (ref 3.87–5.11)
RDW: 14.4 % (ref 11.5–15.5)
WBC: 12.5 10*3/uL — ABNORMAL HIGH (ref 4.0–10.5)
nRBC: 0 % (ref 0.0–0.2)

## 2022-10-06 LAB — TROPONIN I (HIGH SENSITIVITY)
Troponin I (High Sensitivity): 3 ng/L (ref <18)
Troponin I (High Sensitivity): 2 ng/L (ref <18)

## 2022-10-06 LAB — BASIC METABOLIC PANEL
Anion gap: 10 (ref 5–15)
BUN: 10 mg/dL (ref 6–20)
CO2: 28 mmol/L (ref 22–32)
Calcium: 9.4 mg/dL (ref 8.9–10.3)
Chloride: 99 mmol/L (ref 98–111)
Creatinine, Ser: 0.78 mg/dL (ref 0.44–1.00)
GFR, Estimated: >60 mL/min (ref >60)
Glucose, Bld: 126 mg/dL — ABNORMAL HIGH (ref 70–99)
Potassium: 4.4 mmol/L (ref 3.5–5.1)
Sodium: 137 mmol/L (ref 135–145)

## 2022-10-06 MED ORDER — SODIUM CHLORIDE 0.9 % IV BOLUS
1000.0000 mL | Freq: Once | INTRAVENOUS | Status: AC
  Administered 2022-10-06: 1000 mL via INTRAVENOUS

## 2022-10-06 MED ORDER — MECLIZINE HCL 25 MG PO TABS: 25.0000 mg | ORAL_TABLET | Freq: Three times a day (TID) | ORAL | 0 refills | Status: AC | PRN

## 2022-10-06 NOTE — Discharge Instructions (Addendum)
Please follow up with your cardiologist. Based on your description, it may be worth investigating prinzmetal angina aka vasospastic angina.   Regarding the vertigo I have attached some information regarding a procedure which might help if you have the type of vertigo which I suspect called BPPV.  I have also sent a prescription for medication called meclizine which she can use as needed for dizziness symptoms.  You may want to follow-up with the ENT doctor whose contact formation I have provided if you continue to have dizziness episodes without relief.

## 2022-10-06 NOTE — ED Triage Notes (Signed)
Pt started having episodes of squeezing, cramping pain from sternum to under left breast since 7 this am. States only lasts a couple of minutes. States has gotten dizzy with the epidsodes. Denies shob/n/v/d. Pt arrived by PV, a/o. Color wnl. Non diaphoretic.

## 2022-10-06 NOTE — ED Provider Notes (Signed)
[place] EMERGENCY DEPARTMENT AT [facility] Provider Note   CSN: [id number] Arrival date & time: 10/06/22  1030     History  Chief Complaint  Patient presents with   Chest Pain    Tiffany Barker is a 62 y.o. female with past medical history significant for diabetes, hypertension, hyperlipidemia, unstable angina, previous syncope presents with concern for squeezing cramping pain from sternum to the left breast since around 7 AM this morning.  Patient reports that she has episodes that last for only a few minutes at a time.  Patient reports that she did have some dizziness with the episodes.  She denies any shortness of breath, nausea, vomiting.  Patient reports that she had a concussion where she hit her head very hard trying to hang up some Christmas lights late last year and has been having episodes of vertigo which are positional in nature since then.  Patient reports that she had 1 of these episodes.  She denies any vision changes, weakness, numbness.  Patient reports that she took one of her nitro and chest pain subsided substantially.  She reports some dull ache 2/10 at this time.   Chest Pain      Home Medications Prior to Admission medications   Medication Sig Start Date End Date Taking? Authorizing Provider  meclizine (ANTIVERT) 25 MG tablet Take 1 tablet (25 mg total) by mouth 3 (three) times daily as needed for dizziness. 10/06/22  Yes Jahmal Dunavant H, PA-C  cyclobenzaprine (FLEXERIL) 10 MG tablet Take 10 mg by mouth 3 (three) times daily as needed for muscle spasms.    [provider]  diphenhydrAMINE (BENADRYL) 25 mg capsule Take 25 mg by mouth at bedtime.    [provider]  DULoxetine (CYMBALTA) 60 MG capsule Take 60 mg by mouth daily. 06/13/21   [provider]  furosemide (LASIX) 20 MG tablet Take 20 mg by mouth daily.     [provider]  metFORMIN (GLUCOPHAGE) 500 MG tablet Take by mouth 2 (two) times daily with a  meal.    [provider]  metoprolol tartrate (LOPRESSOR) 25 MG tablet Take 75 mg by mouth 2 (two) times daily.     [provider]  nitroGLYCERIN (NITROSTAT) 0.4 MG SL tablet Place 0.4 mg under the tongue as directed. 06/11/21   [provider]  omeprazole (PRILOSEC) 40 MG capsule Take 1 capsule (40 mg total) by mouth daily before breakfast. Patient taking differently: Take 40 mg by mouth 2 (two) times daily. 09/03/18   Iva Boop, MD  rosuvastatin (CRESTOR) 40 MG tablet Take 1 tablet (40 mg total) by mouth daily. 06/16/21   Arty Baumgartner, NP  TRULICITY 1.5 MG/0.5ML SOPN Inject 1.5 mg into the skin once a week. 06/08/21   [provider]      Allergies    Wasp venom and Percocet [oxycodone-acetaminophen]    Review of Systems   Review of Systems  Cardiovascular:  Positive for chest pain.  All other systems reviewed and are negative.   Physical Exam Updated Vital Signs BP 125/79   Pulse 65   Temp (!) 97.5 F (36.4 C) (Oral)   Resp (!) 23   Ht 5\' 3"  (1.6 m)   Wt 92.1 kg   LMP 06/30/2013   SpO2 96%   BMI 35.96 kg/m  Physical Exam Vitals and nursing note reviewed.  Constitutional:      General: She is not in acute distress.    Appearance:  Normal appearance.  HENT:     Head: Normocephalic and atraumatic.  Eyes:     General:        Right eye: No discharge.        Left eye: No discharge.  Cardiovascular:     Rate and Rhythm: Normal rate and regular rhythm.     Heart sounds: No murmur heard.    No friction rub. No gallop.  Pulmonary:     Effort: Pulmonary effort is normal.     Breath sounds: Normal breath sounds.  Chest:     Comments: Some tenderness to palpation of the left-sided anterior chest wall Abdominal:     General: Bowel sounds are normal.     Palpations: Abdomen is soft.  Skin:    General: Skin is warm and dry.     Capillary Refill: Capillary refill takes less than 2 seconds.  Neurological:     Mental Status: She is  alert and oriented to person, place, and time.     Comments: Dizziness, with horizontal nystagmus is replicable on Dix-Hallpike maneuver.  Right sided nystagmus.  Psychiatric:        Mood and Affect: Mood normal.        Behavior: Behavior normal.     ED Results / Procedures / Treatments   Labs (all labs ordered are listed, but only abnormal results are displayed) Labs Reviewed  BASIC METABOLIC PANEL - Abnormal; Notable for the following components:      Result Value   Glucose, Bld 126 (*)    All other components within normal limits  CBC - Abnormal; Notable for the following components:   WBC 12.5 (*)    Hemoglobin 11.9 (*)    All other components within normal limits  TROPONIN I (HIGH SENSITIVITY)  TROPONIN I (HIGH SENSITIVITY)    EKG None  Radiology DG Chest 2 View  Result Date: 10/06/2022 CLINICAL DATA:  Chest pain.  Squeezing and cramping EXAM: CHEST - 2 VIEW COMPARISON:  X-ray 06/15/2021 FINDINGS: The heart size and mediastinal contours are within normal limits. Both lungs are clear. No consolidation, pneumothorax or effusion. No edema. The visualized skeletal structures are unremarkable. Overlapping cardiac leads. IMPRESSION: No acute cardiopulmonary disease Electronically Signed   By: Karen Kays M.D.   On: 10/06/2022 12:05    Procedures Procedures    Medications Ordered in ED Medications  sodium chloride 0.9 % bolus 1,000 mL (0 mLs Intravenous Stopped 10/06/22 1327)    ED Course/ Medical Decision Making/ A&P                                 Medical Decision Making Amount and/or Complexity of Data Reviewed Labs: ordered. Radiology: ordered.   This patient is a 62 y.o. female  who presents to the ED for concern of chest pain, near syncope, dizziness.   Differential diagnoses prior to evaluation: The emergent differential diagnosis includes, but is not limited to,  ACS, AAS, PE, Mallory-Weiss, Boerhaave's, Pneumonia, acute bronchitis, asthma or COPD  exacerbation, anxiety, MSK pain or traumatic injury to the chest, acid reflux, CVA, ACS, arrhythmia, vasovagal syncope, orthostatic hypotension, sepsis, hypoglycemia, electrolyte disturbance, respiratory failure, symptomatic anemia, dehydration, heat injury, polypharmacy, malignancy. This is not an exhaustive differential.   Past Medical History / Co-morbidities / Social History: Diabetes, hypertension, anxiety, panic attacks, unstable angina  Additional history: Chart reviewed. Pertinent results include: Reviewed previous cardiac catheterization, patient with no significant blockage or  stenosis noted previously  Physical Exam: Physical exam performed. The pertinent findings include: Patient does have reproducible dizziness that seems suspicious for right-sided BPPV.  Vital signs are stable in the emergency department she did have 1 brief episode of tachypnea at discharge but had been otherwise with normal respirations throughout her emergency department visit, suspect that this may have been a spurious reading.  Normal oxygen saturation on room air.  She is somewhat hypertensive on arrival, BP 136/93 but normotensive at time of discharge, blood pressure 125/79.  Normal heart rate and rhythm.  Lab Tests/Imaging studies: I personally interpreted labs/imaging and the pertinent results include: BMP notable for mild hyperglycemia in context of nonfasting lab values, glucose 126.  CBC is notable for mild leukocytosis, white blood cells 12.5, mild anemia, hemoglobin 11.9, otherwise overall unremarkable, initial troponin is 4, with delta at 2.  No evidence of acute ischemic heart disease at this time..  I did interpreted plain film chest x-ray shows no evidence of acute intrathoracic abnormality.  I agree with the radiologist interpretation.  Cardiac monitoring: EKG obtained and interpreted by myself and attending physician which shows: Normal sinus rhythm, borderline T wave abnormalities in anterior leads,  does not seem significantly changed from baseline   Medications: I ordered medication including fluid bolus for dizziness.  I have reviewed the patients home medicines and have made adjustments as needed.   Disposition: After consideration of the diagnostic results and the patients response to treatment, I feel that patient with no evidence of acute ACS at this time, I do think that it be reasonable to follow-up with her cardiologist, some suspicion for Prinzmetal or vasospastic angina.  Additionally with some suspicion for BPPV, and encouraged ENT follow-up, provided resources for Epley maneuver.Marland Kitchen   emergency department workup does not suggest an emergent condition requiring admission or immediate intervention beyond what has been performed at this time. The plan is: as above. The patient is safe for discharge and has been instructed to return immediately for worsening symptoms, change in symptoms or any other concerns.  Final Clinical Impression(s) / ED Diagnoses Final diagnoses:  Chest pain, unspecified type  Benign paroxysmal positional vertigo, unspecified laterality    Rx / DC Orders ED Discharge Orders          Ordered    meclizine (ANTIVERT) 25 MG tablet  3 times daily PRN        10/06/22 1432              Rodina Pinales, Harrel Carina, PA-C 10/06/22 1542    Rondel Baton, MD 10/06/22 1757

## 2022-12-13 ENCOUNTER — Other Ambulatory Visit (HOSPITAL_COMMUNITY): Payer: Self-pay | Admitting: Physician Assistant

## 2022-12-13 DIAGNOSIS — Z1231 Encounter for screening mammogram for malignant neoplasm of breast: Secondary | ICD-10-CM

## 2022-12-18 ENCOUNTER — Ambulatory Visit (HOSPITAL_COMMUNITY)
Admission: RE | Admit: 2022-12-18 | Discharge: 2022-12-18 | Disposition: A | Discharge status: Home / Self Care | Payer: BC Managed Care – PPO | Source: Ambulatory Visit | Attending: Physician Assistant | Admitting: Physician Assistant

## 2022-12-18 ENCOUNTER — Encounter (HOSPITAL_COMMUNITY): Payer: Self-pay

## 2022-12-18 DIAGNOSIS — Z1231 Encounter for screening mammogram for malignant neoplasm of breast: Secondary | ICD-10-CM | POA: Insufficient documentation

## 2022-12-21 ENCOUNTER — Other Ambulatory Visit (HOSPITAL_COMMUNITY): Payer: Self-pay | Admitting: Physician Assistant

## 2022-12-21 ENCOUNTER — Encounter (HOSPITAL_COMMUNITY): Payer: Self-pay | Admitting: Physician Assistant

## 2022-12-21 DIAGNOSIS — R928 Other abnormal and inconclusive findings on diagnostic imaging of breast: Secondary | ICD-10-CM

## 2022-12-29 ENCOUNTER — Encounter (INDEPENDENT_AMBULATORY_CARE_PROVIDER_SITE_OTHER): Payer: Self-pay | Admitting: *Deleted

## 2023-01-02 ENCOUNTER — Ambulatory Visit (HOSPITAL_COMMUNITY): Payer: BC Managed Care – PPO

## 2023-01-23 ENCOUNTER — Encounter (HOSPITAL_COMMUNITY): Payer: BC Managed Care – PPO

## 2023-01-23 ENCOUNTER — Encounter (HOSPITAL_COMMUNITY): Payer: Self-pay

## 2023-01-23 ENCOUNTER — Ambulatory Visit (HOSPITAL_COMMUNITY): Payer: BC Managed Care – PPO | Attending: Physician Assistant

## 2023-01-23 ENCOUNTER — Inpatient Hospital Stay (HOSPITAL_COMMUNITY): Admission: RE | Admit: 2023-01-23 | Payer: BC Managed Care – PPO | Source: Ambulatory Visit

## 2023-01-23 ENCOUNTER — Other Ambulatory Visit (HOSPITAL_COMMUNITY): Payer: BC Managed Care – PPO

## 2023-02-22 ENCOUNTER — Ambulatory Visit (HOSPITAL_COMMUNITY)
Admission: RE | Admit: 2023-02-22 | Discharge: 2023-02-22 | Disposition: A | Discharge status: Home / Self Care | Payer: Self-pay | Source: Ambulatory Visit | Attending: Physician Assistant | Admitting: Physician Assistant

## 2023-02-22 DIAGNOSIS — R928 Other abnormal and inconclusive findings on diagnostic imaging of breast: Secondary | ICD-10-CM | POA: Insufficient documentation

## 2023-06-28 ENCOUNTER — Encounter (INDEPENDENT_AMBULATORY_CARE_PROVIDER_SITE_OTHER): Payer: Self-pay | Admitting: *Deleted

## 2023-07-27 ENCOUNTER — Telehealth: Payer: Self-pay

## 2023-07-27 NOTE — Telephone Encounter (Signed)
 Attempted follow up call to [facility] client. No answer, left voicemail.   Last appointment at [facility] was 04/10/23 no future appointment noted in system at this time.  Last documented A1C was 04/10/23 at 8.2  Will mail care connect welcome letter as well as letter from this RN regarding diabetes and an informational sheet on Diabetic portions and MyPlate.  PLan : will continue to monitor and follow up to offer support and diabetic education as needed.   Kris Pester RN [employer]

## 2023-08-29 ENCOUNTER — Telehealth: Payer: Self-pay

## 2023-08-29 NOTE — Telephone Encounter (Signed)
 Attempted follow up call to [facility] client. No answer today and unable to leave voicemail due to no VM set up.  Client is diabetic and last recorded A1C was 8.2  on 04/10/23. Unable to see any further labs or provider notes due to change in Health Department's changing EHR to [facility].  Noted that client was recently seen at [facility] ED on 08/20/23 after a fall.  Plan: will continue to attempt to reach client, will mail welcome letters from case management care connect for diabetics as well as MyPlate education information and know your numbers along with a reminder to schedule and continue to follow up with the Health Department for her primary medical care. Will also include a [facility] welcome letter.   Avelina JONELLE Skeen RN [employer]

## 2023-09-09 IMAGING — DX DG CHEST 2V
2 series · 2 of 2 positions shown · non-contrast
Comparison: 8639

CLINICAL DATA: Chest pain

EXAM:
CHEST - 2 VIEW

[chest pa]
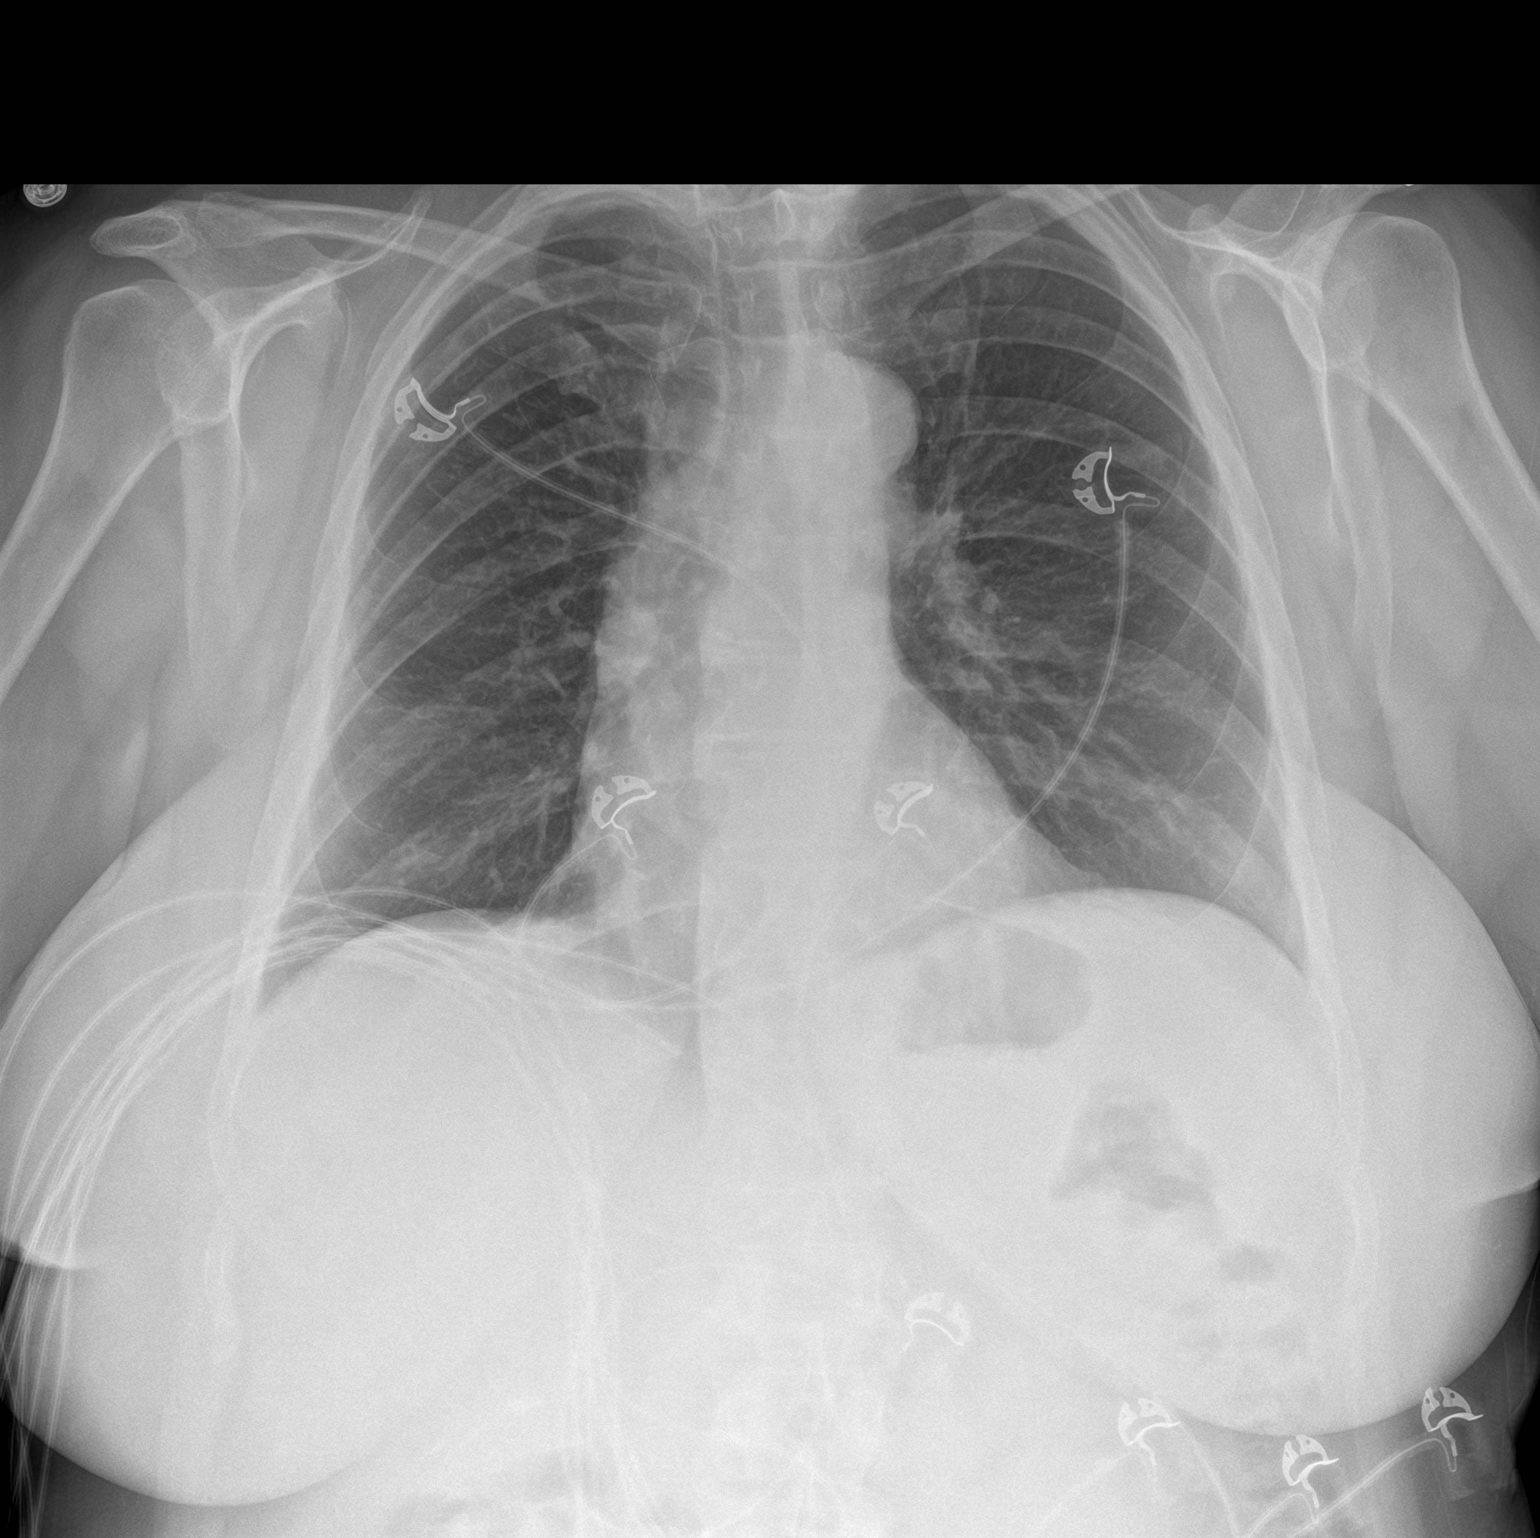

[chest lat]
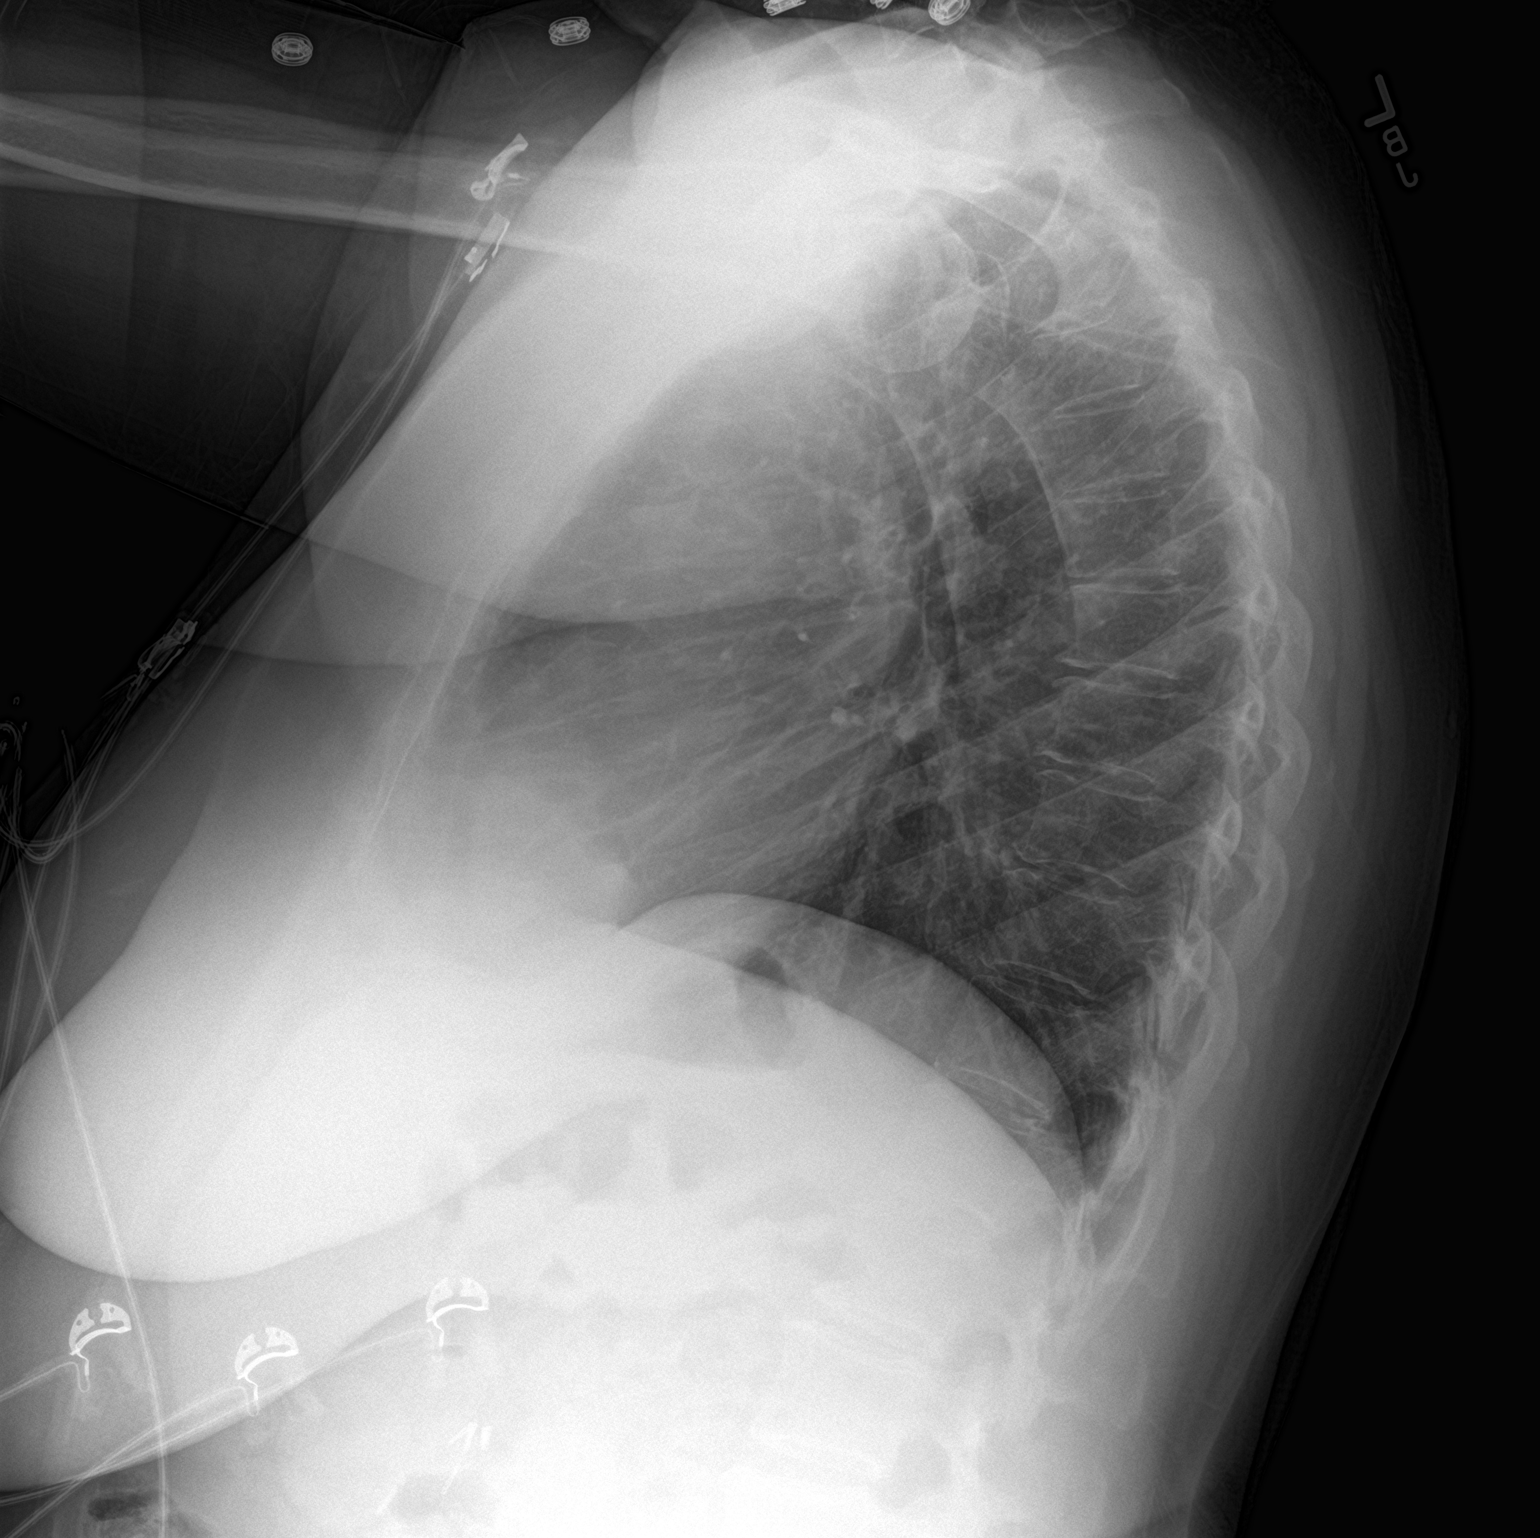

[2 of 2 positions shown; findings below may reference images not displayed]

FINDINGS: The heart size and mediastinal contours are within normal limits.
Both lungs are clear. No pleural effusion or pneumothorax. The
visualized skeletal structures are unremarkable.
IMPRESSION: No acute process in the chest.

## 2023-09-09 IMAGING — CT CT ANGIO CHEST
2 of 7 series · 18 of 46 positions shown · IV contrast (Omnipaque or Isovue)
Comparison: 06/15/2021 chest x-ray

CLINICAL DATA: Chest pain and shortness of breath since this
morning

EXAM:
CT ANGIOGRAPHY CHEST WITH CONTRAST
TECHNIQUE: Multidetector CT imaging of the chest was performed using the
standard protocol during bolus administration of intravenous
contrast. Multiplanar CT image reconstructions and MIPs were
obtained to evaluate the vascular anatomy.

[Series 5: pe axial thins · axial · 0.70mm/px · z∈[+1133,+1403]mm · 15 of 381 slices shown]
[im 22/381  lung]
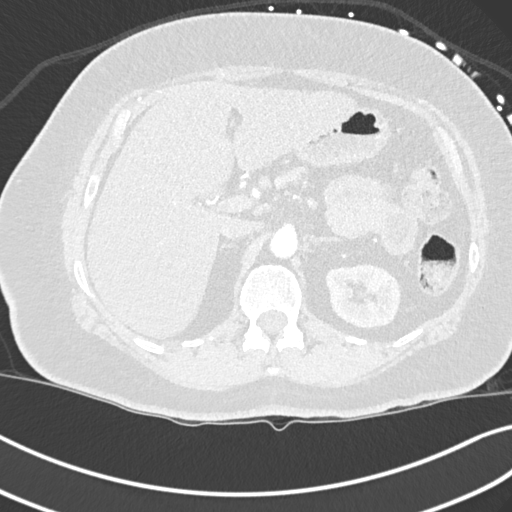
[im 43/381  soft-tissue]
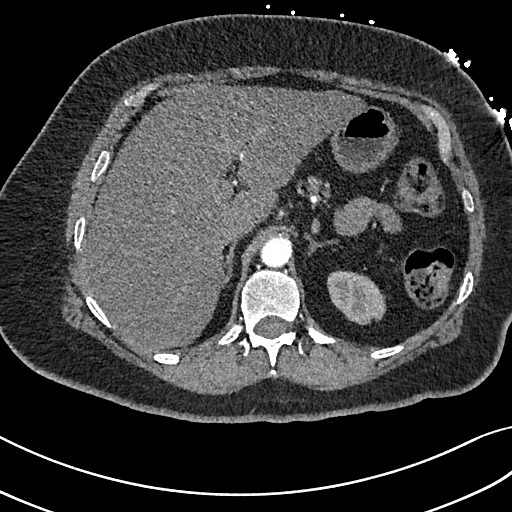
[im 64/381  lung]
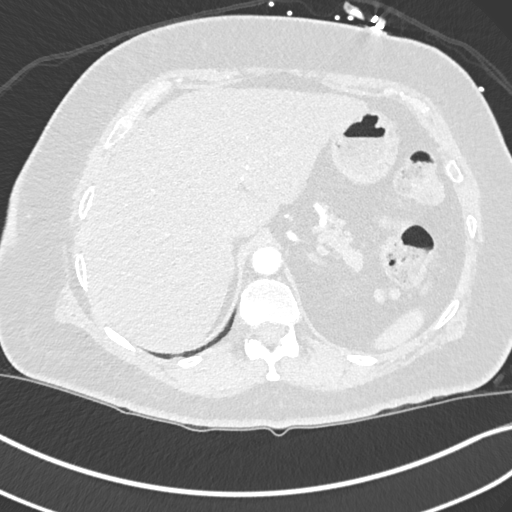
[im 85/381  soft-tissue]
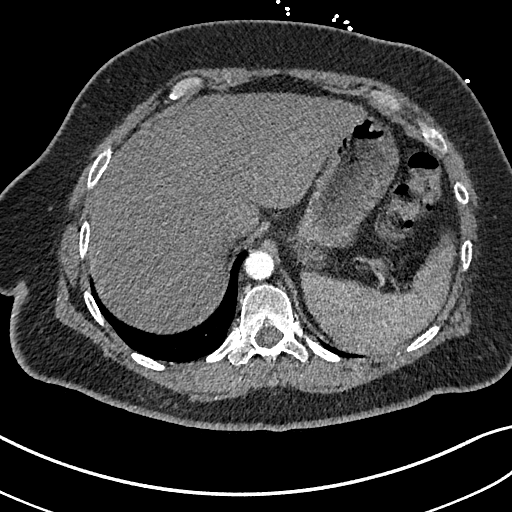
[im 127/381  lung]
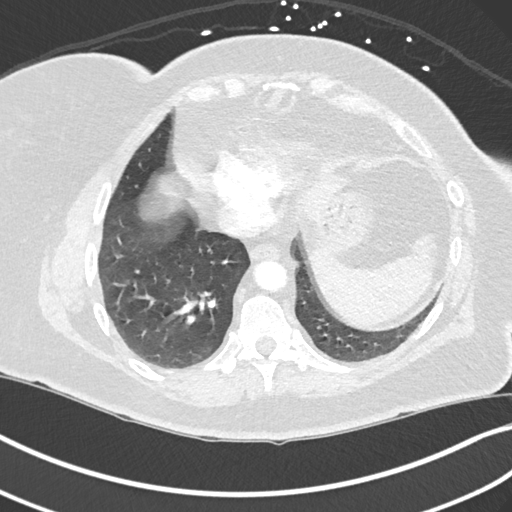
[im 148/381  soft-tissue]
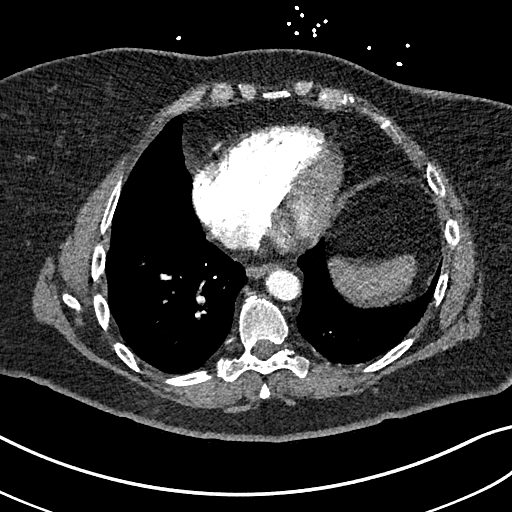
[im 169/381  lung]
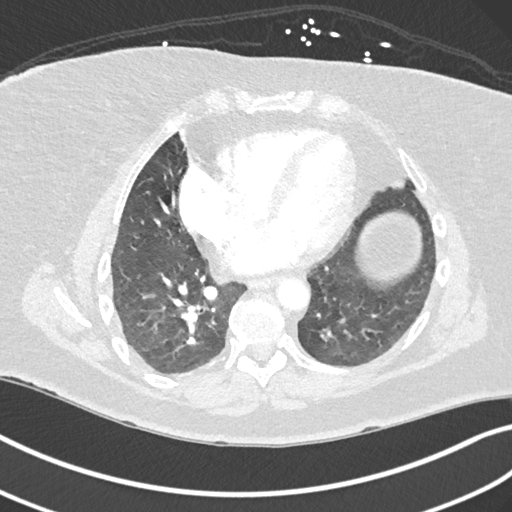
[im 191/381  soft-tissue]
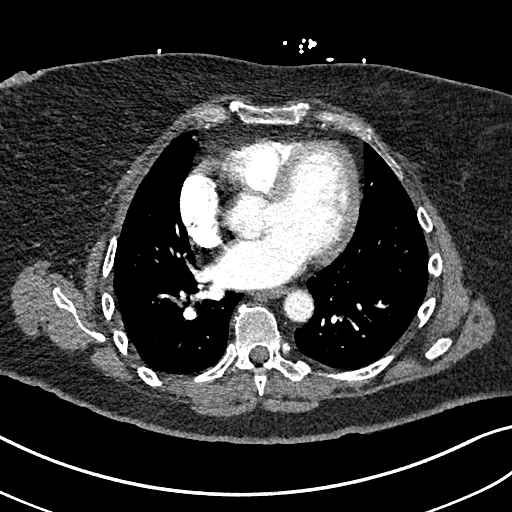
[im 212/381  lung]
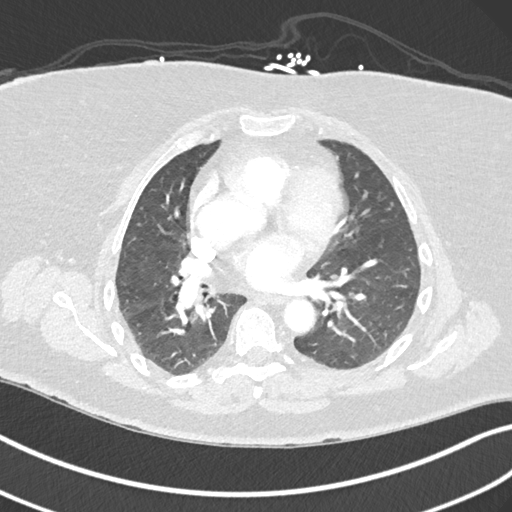
[im 233/381  soft-tissue]
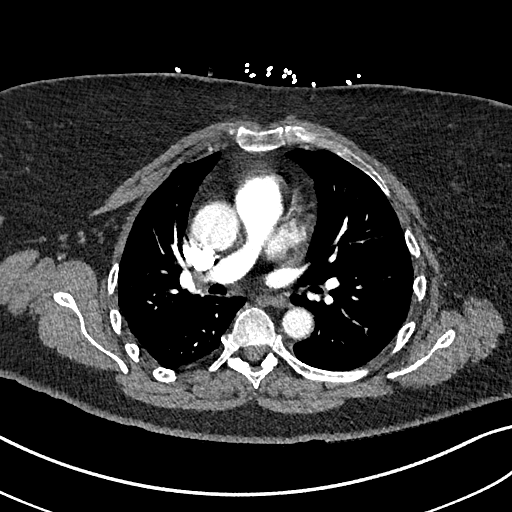
[im 254/381  lung]
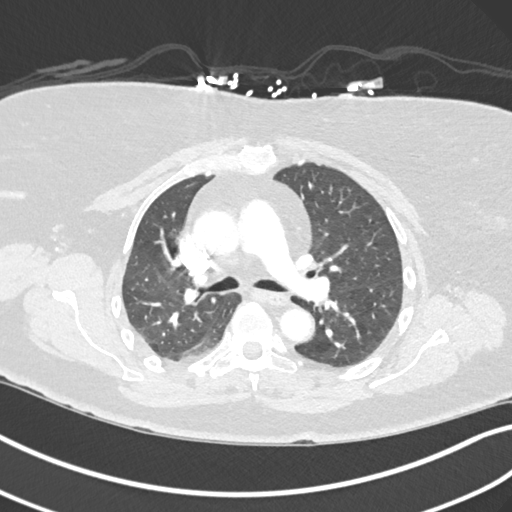
[im 296/381  soft-tissue]
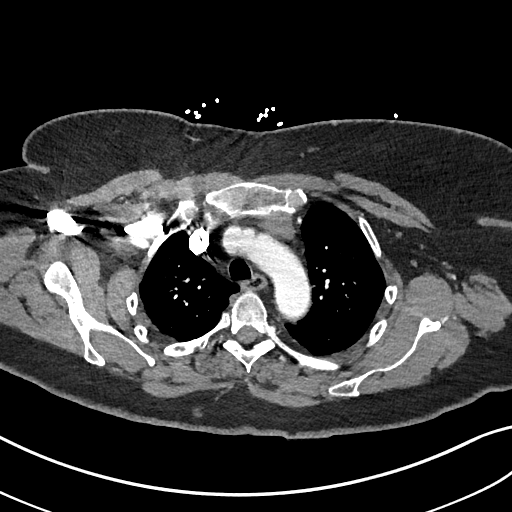
[im 317/381  lung]
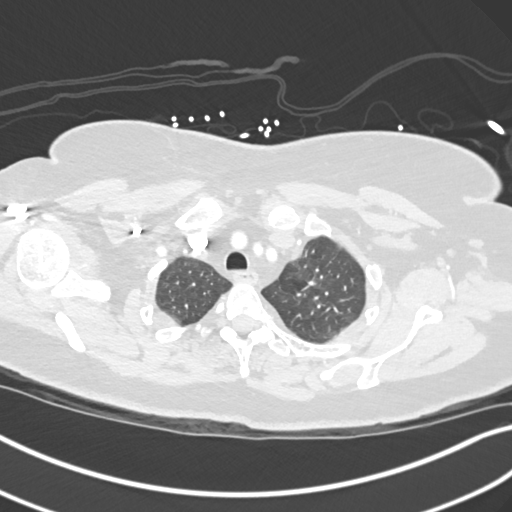
[im 338/381  soft-tissue]
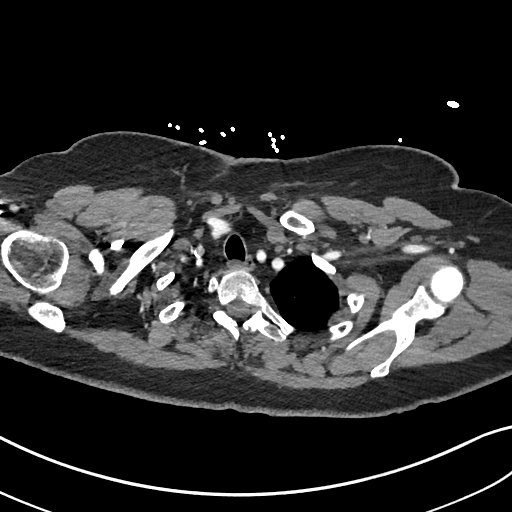
[im 359/381  lung]
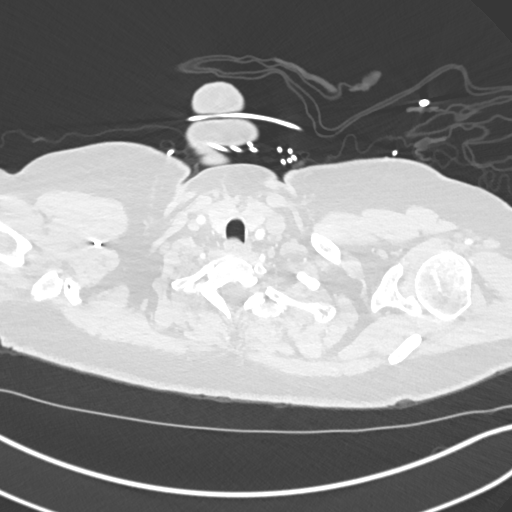

[Series 7: cor soft · coronal · 0.60mm/px · 3 of 167 slices shown]
[im 42/167  soft-tissue]
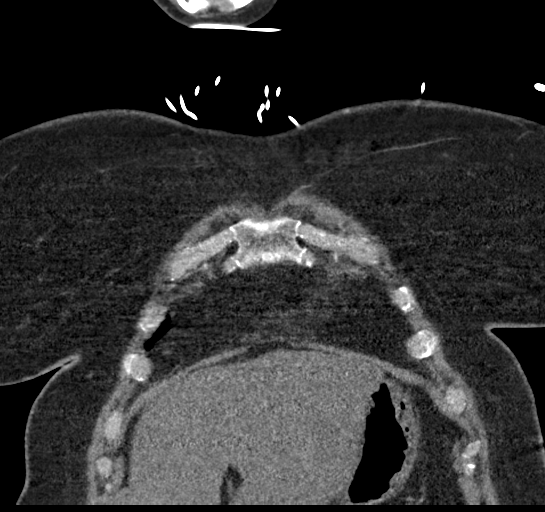
[im 84/167  soft-tissue]
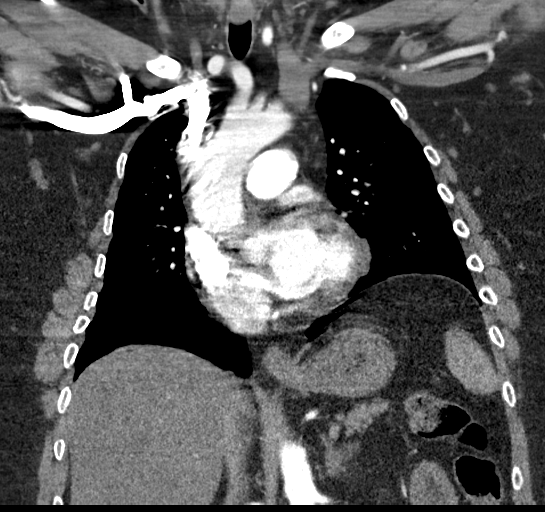
[im 125/167  soft-tissue]
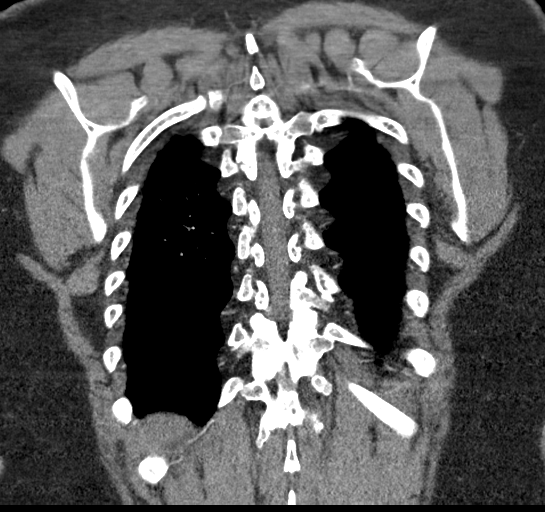

[18 of 46 positions shown; findings below may reference images not displayed]

RADIATION DOSE REDUCTION: This exam was performed according to the
departmental dose-optimization program which includes automated
exposure control, adjustment of the mA and/or kV according to
patient size and/or use of iterative reconstruction technique.

CONTRAST:  80mL OMNIPAQUE IOHEXOL 350 MG/ML SOLN
FINDINGS: Cardiovascular: Pulmonary arteries are normal in caliber and appear
patent. No significant filling defect or pulmonary embolus by CTA.

Intact thoracic aorta with trace atherosclerotic change. Patent 3
vessel arch anatomy. No aneurysm or dissection. No mediastinal
hemorrhage or hematoma. Normal heart size. No pericardial effusion.

Central venous structures are patent.  No Labelle Salha finding.

Mediastinum/Nodes: No enlarged mediastinal, hilar, or axillary lymph
nodes. Thyroid gland, trachea, and esophagus demonstrate no
significant findings.

Lungs/Pleura: Lungs are clear. No pleural effusion or pneumothorax.
Slight nonspecific pleural fatty proliferation more so on the right
than the left. Otherwise no other pleural abnormality, effusion, or
pneumothorax.

Upper Abdomen: Mild hepatic steatosis noted. No acute upper
abdominal finding. Mesenteric and renal vascular visualized appear
patent.

Musculoskeletal: Right thoracic upper back subcutaneous
well-circumscribed nodule extending to the skin surface measures 12
mm, image [DATE] suspect sebaceous cyst.

No other chest wall soft tissue abnormality or asymmetry. No acute
osseous finding.

Review of the MIP images confirms the above findings.
IMPRESSION: Negative for significant acute pulmonary embolus by CTA.

No other acute intrathoracic finding.

Aortic Atherosclerosis ([id number]-[id number]).

## 2023-11-30 NOTE — Congregational Nurse Program (Signed)
 Wellness call to [facility] client by Caldwell ROCKFORD MSW Intern with [employer] .  PCP: [facility] Last seen on 09/20/23 Next appointment on 12/21/23 at 0800  Medications no needs uses [facility] and [facility]  Food insecurities: food can run low, but she has enough to feed her family Does use food pantries.  Housing/Utilities: rent and utilities can be hard to pay but she is managing She says her [employer] is very understanding and works with her.   No safety or transportation needs  Will continue to follow  Tiffany Barker Skeen RN [employer]

## 2023-12-08 ENCOUNTER — Emergency Department (HOSPITAL_COMMUNITY)
Admission: EM | Admit: 2023-12-08 | Discharge: 2023-12-08 | Disposition: A | Discharge status: Home / Self Care | Payer: Self-pay | Attending: Emergency Medicine | Admitting: Emergency Medicine

## 2023-12-08 ENCOUNTER — Encounter (HOSPITAL_COMMUNITY): Payer: Self-pay

## 2023-12-08 ENCOUNTER — Other Ambulatory Visit: Payer: Self-pay

## 2023-12-08 DIAGNOSIS — Z79899 Other long term (current) drug therapy: Secondary | ICD-10-CM | POA: Insufficient documentation

## 2023-12-08 DIAGNOSIS — Z794 Long term (current) use of insulin: Secondary | ICD-10-CM | POA: Insufficient documentation

## 2023-12-08 DIAGNOSIS — Z7984 Long term (current) use of oral hypoglycemic drugs: Secondary | ICD-10-CM | POA: Insufficient documentation

## 2023-12-08 DIAGNOSIS — I10 Essential (primary) hypertension: Secondary | ICD-10-CM | POA: Insufficient documentation

## 2023-12-08 DIAGNOSIS — E119 Type 2 diabetes mellitus without complications: Secondary | ICD-10-CM | POA: Insufficient documentation

## 2023-12-08 DIAGNOSIS — N6082 Other benign mammary dysplasias of left breast: Secondary | ICD-10-CM | POA: Insufficient documentation

## 2023-12-08 MED ORDER — DOXYCYCLINE HYCLATE 100 MG PO CAPS: 100.0000 mg | ORAL_CAPSULE | Freq: Two times a day (BID) | ORAL | 0 refills | Status: AC

## 2023-12-08 NOTE — ED Notes (Signed)
 Patient has a raised bump area above the left breast that is swollen and is tender to the touch it is red  and warm.

## 2023-12-08 NOTE — Discharge Instructions (Signed)
 As discussed, you may alternate with ice and heat to the area.  Try to avoid squeezing or sticking needles or pens into the area.  Over-the-counter ibuprofen  800 mg 3 times a day with food to help with discomfort.  Take the antibiotic as directed.  You may call Dr. Milford office (dermatology) to arrange follow-up appointment or you may contact the surgeons office that is also listed.  Return to the emergency department if you develop any new or worsening symptoms.

## 2023-12-08 NOTE — ED Triage Notes (Signed)
 Pt c/o cyst on left breast; hx of cyst. A&Ox4.

## 2023-12-08 NOTE — ED Provider Notes (Signed)
 [place] EMERGENCY DEPARTMENT AT [facility] Provider Note   CSN: [id number] Arrival date & time: 12/08/23  0825     Patient presents with: Cyst   Tiffany Barker is a 63 y.o. female.  {Add pertinent medical, surgical, social history, OB history to HPI:32947} HPI     Prior to Admission medications   Medication Sig Start Date End Date Taking? Authorizing Provider  cyclobenzaprine (FLEXERIL) 10 MG tablet Take 10 mg by mouth 3 (three) times daily as needed for muscle spasms.    [provider]  diphenhydrAMINE  (BENADRYL ) 25 mg capsule Take 25 mg by mouth at bedtime.    [provider]  DULoxetine  (CYMBALTA ) 60 MG capsule Take 60 mg by mouth daily. 06/13/21   [provider]  furosemide (LASIX) 20 MG tablet Take 20 mg by mouth daily.     [provider]  meclizine  (ANTIVERT ) 25 MG tablet Take 1 tablet (25 mg total) by mouth 3 (three) times daily as needed for dizziness. 10/06/22   Prosperi, Christian H, PA-C  metFORMIN (GLUCOPHAGE) 500 MG tablet Take by mouth 2 (two) times daily with a meal.    [provider]  metoprolol  tartrate (LOPRESSOR ) 25 MG tablet Take 75 mg by mouth 2 (two) times daily.     [provider]  nitroGLYCERIN  (NITROSTAT ) 0.4 MG SL tablet Place 0.4 mg under the tongue as directed. 06/11/21   [provider]  omeprazole  (PRILOSEC) 40 MG capsule Take 1 capsule (40 mg total) by mouth daily before breakfast. Patient taking differently: Take 40 mg by mouth 2 (two) times daily. 09/03/18   Avram Lupita BRAVO, MD  rosuvastatin  (CRESTOR ) 40 MG tablet Take 1 tablet (40 mg total) by mouth daily. 06/16/21   Henry Manuelita NOVAK, NP  TRULICITY 1.5 MG/0.5ML SOPN Inject 1.5 mg into the skin once a week. 06/08/21   [provider]    Allergies: Wasp venom and Percocet [oxycodone-acetaminophen ]    Review of Systems  Updated Vital Signs BP 132/73   Pulse 62   Temp 98 F (36.7 C) (Oral)   Resp 16   Ht  5' 4 (1.626 m)   Wt 86.2 kg   LMP 06/30/2013   SpO2 94%   BMI 32.61 kg/m   Physical Exam  (all labs ordered are listed, but only abnormal results are displayed) Labs Reviewed - No data to display  EKG: None  Radiology: No results found.  {Document cardiac monitor, telemetry assessment procedure when appropriate:32947} Procedures   Medications Ordered in the ED - No data to display    {Click here for ABCD2, HEART and other calculators REFRESH Note before signing:1}                              Medical Decision Making Amount and/or Complexity of Data Reviewed Discussion of management or test interpretation with external provider(s): Patient with likely sebaceous cyst upper portion of the left breast area.  Small area of surrounding erythema that may represent a developing cellulitis.  History of same that required sedation through general surgery.  Offered incision and drainage here, but patient has significant fear of needles.  She has opted to follow-up with dermatology or general surgery and prefers to use compresses and abx at this time.  Strict return precautions given  Risk Prescription drug management.     {Document critical care time when appropriate  Document review of labs and clinical decision tools  ie CHADS2VASC2, etc  Document your independent review of radiology images and any outside records  Document your discussion with family members, caretakers and with consultants  Document social determinants of health affecting pt's care  Document your decision making why or why not admission, treatments were needed:32947:::1}   Final diagnoses:  None    ED Discharge Orders     None

## 2023-12-17 ENCOUNTER — Telehealth: Payer: Self-pay

## 2023-12-17 NOTE — Telephone Encounter (Signed)
 Attempted follow up call of [facility] client after recent visit to ED. NO answer today, left VM requesting return call. If I do not hear back from client by the end of day today, will plan to attempt call again 12/18/23  Her next appointment with her PCP [facility] is 12/21/23   Client was seen on 12/08/23 from complaint of painful area cyst on her breast.   Avelina JONELLE Skeen RN [employer]/[facility]

## 2023-12-21 ENCOUNTER — Encounter: Payer: Self-pay | Admitting: *Deleted

## 2024-01-16 ENCOUNTER — Other Ambulatory Visit: Payer: Self-pay | Admitting: *Deleted

## 2024-01-16 DIAGNOSIS — N6002 Solitary cyst of left breast: Secondary | ICD-10-CM

## 2024-01-22 ENCOUNTER — Ambulatory Visit: Payer: Self-pay | Admitting: General Surgery
